# Patient Record
Sex: Male | Born: 1945
Health system: Southern US, Community
[De-identification: ages and names within clinical notes are randomized; demographics above are authoritative.]

## PROBLEM LIST (undated history)

## (undated) DIAGNOSIS — G309 Alzheimer's disease, unspecified: Secondary | ICD-10-CM

## (undated) DIAGNOSIS — F0280 Dementia in other diseases classified elsewhere without behavioral disturbance: Secondary | ICD-10-CM

## (undated) DIAGNOSIS — M79602 Pain in left arm: Secondary | ICD-10-CM

## (undated) HISTORY — PX: KNEE SURGERY: SHX244

## (undated) HISTORY — DX: Pain in left arm: M79.602

## (undated) HISTORY — PX: NM PET IMG ALZHEIMERS: HXRAD13

## (undated) HISTORY — PX: SHOULDER SURGERY: SHX246

---

## 2016-04-14 DIAGNOSIS — L4050 Arthropathic psoriasis, unspecified: Secondary | ICD-10-CM | POA: Diagnosis not present

## 2016-04-14 DIAGNOSIS — L400 Psoriasis vulgaris: Secondary | ICD-10-CM | POA: Diagnosis not present

## 2016-04-14 DIAGNOSIS — Z79899 Other long term (current) drug therapy: Secondary | ICD-10-CM | POA: Diagnosis not present

## 2016-10-03 DIAGNOSIS — R69 Illness, unspecified: Secondary | ICD-10-CM | POA: Diagnosis not present

## 2017-02-10 DIAGNOSIS — Z6833 Body mass index (BMI) 33.0-33.9, adult: Secondary | ICD-10-CM | POA: Diagnosis not present

## 2017-02-10 DIAGNOSIS — M25441 Effusion, right hand: Secondary | ICD-10-CM | POA: Diagnosis not present

## 2017-02-10 DIAGNOSIS — S60221A Contusion of right hand, initial encounter: Secondary | ICD-10-CM | POA: Diagnosis not present

## 2017-10-05 DIAGNOSIS — R69 Illness, unspecified: Secondary | ICD-10-CM | POA: Diagnosis not present

## 2017-10-07 DIAGNOSIS — R69 Illness, unspecified: Secondary | ICD-10-CM | POA: Diagnosis not present

## 2017-11-23 DIAGNOSIS — R69 Illness, unspecified: Secondary | ICD-10-CM | POA: Diagnosis not present

## 2018-02-15 DIAGNOSIS — R69 Illness, unspecified: Secondary | ICD-10-CM | POA: Diagnosis not present

## 2018-04-07 DIAGNOSIS — M792 Neuralgia and neuritis, unspecified: Secondary | ICD-10-CM | POA: Diagnosis not present

## 2018-04-07 DIAGNOSIS — R51 Headache: Secondary | ICD-10-CM | POA: Diagnosis not present

## 2018-04-07 DIAGNOSIS — R69 Illness, unspecified: Secondary | ICD-10-CM | POA: Diagnosis not present

## 2018-04-21 DIAGNOSIS — L400 Psoriasis vulgaris: Secondary | ICD-10-CM | POA: Diagnosis not present

## 2018-04-21 DIAGNOSIS — B029 Zoster without complications: Secondary | ICD-10-CM | POA: Diagnosis not present

## 2018-04-21 DIAGNOSIS — Z79899 Other long term (current) drug therapy: Secondary | ICD-10-CM | POA: Diagnosis not present

## 2018-04-21 DIAGNOSIS — L4050 Arthropathic psoriasis, unspecified: Secondary | ICD-10-CM | POA: Diagnosis not present

## 2018-12-13 DIAGNOSIS — R69 Illness, unspecified: Secondary | ICD-10-CM | POA: Diagnosis not present

## 2019-06-16 DIAGNOSIS — Z6832 Body mass index (BMI) 32.0-32.9, adult: Secondary | ICD-10-CM | POA: Diagnosis not present

## 2019-06-16 DIAGNOSIS — F4310 Post-traumatic stress disorder, unspecified: Secondary | ICD-10-CM | POA: Diagnosis not present

## 2019-06-16 DIAGNOSIS — R4701 Aphasia: Secondary | ICD-10-CM | POA: Diagnosis not present

## 2019-06-16 DIAGNOSIS — R69 Illness, unspecified: Secondary | ICD-10-CM | POA: Diagnosis not present

## 2019-06-16 DIAGNOSIS — E782 Mixed hyperlipidemia: Secondary | ICD-10-CM | POA: Diagnosis not present

## 2019-07-04 DIAGNOSIS — R69 Illness, unspecified: Secondary | ICD-10-CM | POA: Diagnosis not present

## 2019-07-15 DIAGNOSIS — E782 Mixed hyperlipidemia: Secondary | ICD-10-CM | POA: Diagnosis not present

## 2019-07-15 DIAGNOSIS — Z1389 Encounter for screening for other disorder: Secondary | ICD-10-CM | POA: Diagnosis not present

## 2019-07-15 DIAGNOSIS — F4310 Post-traumatic stress disorder, unspecified: Secondary | ICD-10-CM | POA: Diagnosis not present

## 2019-07-15 DIAGNOSIS — L409 Psoriasis, unspecified: Secondary | ICD-10-CM | POA: Diagnosis not present

## 2019-07-15 DIAGNOSIS — Z6833 Body mass index (BMI) 33.0-33.9, adult: Secondary | ICD-10-CM | POA: Diagnosis not present

## 2019-07-15 DIAGNOSIS — R69 Illness, unspecified: Secondary | ICD-10-CM | POA: Diagnosis not present

## 2019-07-15 DIAGNOSIS — Z1331 Encounter for screening for depression: Secondary | ICD-10-CM | POA: Diagnosis not present

## 2019-07-15 DIAGNOSIS — R4701 Aphasia: Secondary | ICD-10-CM | POA: Diagnosis not present

## 2020-02-14 DIAGNOSIS — R69 Illness, unspecified: Secondary | ICD-10-CM | POA: Diagnosis not present

## 2020-03-28 DIAGNOSIS — R69 Illness, unspecified: Secondary | ICD-10-CM | POA: Diagnosis not present

## 2020-05-14 DIAGNOSIS — R69 Illness, unspecified: Secondary | ICD-10-CM | POA: Diagnosis not present

## 2020-10-04 DIAGNOSIS — G309 Alzheimer's disease, unspecified: Secondary | ICD-10-CM | POA: Diagnosis not present

## 2020-10-04 DIAGNOSIS — R410 Disorientation, unspecified: Secondary | ICD-10-CM | POA: Diagnosis not present

## 2020-10-04 DIAGNOSIS — R079 Chest pain, unspecified: Secondary | ICD-10-CM | POA: Diagnosis not present

## 2020-10-04 DIAGNOSIS — R69 Illness, unspecified: Secondary | ICD-10-CM | POA: Diagnosis not present

## 2020-10-04 DIAGNOSIS — R030 Elevated blood-pressure reading, without diagnosis of hypertension: Secondary | ICD-10-CM | POA: Diagnosis not present

## 2021-03-21 DIAGNOSIS — E7849 Other hyperlipidemia: Secondary | ICD-10-CM | POA: Diagnosis not present

## 2021-03-21 DIAGNOSIS — E538 Deficiency of other specified B group vitamins: Secondary | ICD-10-CM | POA: Diagnosis not present

## 2021-03-21 DIAGNOSIS — R69 Illness, unspecified: Secondary | ICD-10-CM | POA: Diagnosis not present

## 2021-03-21 DIAGNOSIS — Z0001 Encounter for general adult medical examination with abnormal findings: Secondary | ICD-10-CM | POA: Diagnosis not present

## 2021-03-21 DIAGNOSIS — G309 Alzheimer's disease, unspecified: Secondary | ICD-10-CM | POA: Diagnosis not present

## 2021-03-21 DIAGNOSIS — Z6830 Body mass index (BMI) 30.0-30.9, adult: Secondary | ICD-10-CM | POA: Diagnosis not present

## 2021-06-26 ENCOUNTER — Encounter: Payer: Self-pay | Admitting: *Deleted

## 2021-07-24 ENCOUNTER — Ambulatory Visit: Payer: Self-pay

## 2021-08-19 DIAGNOSIS — R69 Illness, unspecified: Secondary | ICD-10-CM | POA: Diagnosis not present

## 2021-08-19 DIAGNOSIS — E782 Mixed hyperlipidemia: Secondary | ICD-10-CM | POA: Diagnosis not present

## 2021-08-19 DIAGNOSIS — G309 Alzheimer's disease, unspecified: Secondary | ICD-10-CM | POA: Diagnosis not present

## 2021-09-03 ENCOUNTER — Emergency Department (HOSPITAL_COMMUNITY): Payer: No Typology Code available for payment source

## 2021-09-03 ENCOUNTER — Encounter (HOSPITAL_COMMUNITY): Payer: Self-pay | Admitting: Emergency Medicine

## 2021-09-03 ENCOUNTER — Emergency Department (HOSPITAL_COMMUNITY)
Admission: EM | Admit: 2021-09-03 | Discharge: 2021-09-04 | Disposition: A | Discharge status: Home / Self Care | Payer: No Typology Code available for payment source | Attending: Emergency Medicine | Admitting: Emergency Medicine

## 2021-09-03 ENCOUNTER — Other Ambulatory Visit: Payer: Self-pay

## 2021-09-03 DIAGNOSIS — F0280 Dementia in other diseases classified elsewhere without behavioral disturbance: Secondary | ICD-10-CM | POA: Diagnosis not present

## 2021-09-03 DIAGNOSIS — M79603 Pain in arm, unspecified: Secondary | ICD-10-CM | POA: Diagnosis not present

## 2021-09-03 DIAGNOSIS — G309 Alzheimer's disease, unspecified: Secondary | ICD-10-CM | POA: Diagnosis not present

## 2021-09-03 DIAGNOSIS — I498 Other specified cardiac arrhythmias: Secondary | ICD-10-CM | POA: Diagnosis not present

## 2021-09-03 DIAGNOSIS — M79602 Pain in left arm: Secondary | ICD-10-CM | POA: Insufficient documentation

## 2021-09-03 DIAGNOSIS — R079 Chest pain, unspecified: Secondary | ICD-10-CM | POA: Insufficient documentation

## 2021-09-03 DIAGNOSIS — Z87891 Personal history of nicotine dependence: Secondary | ICD-10-CM | POA: Diagnosis not present

## 2021-09-03 DIAGNOSIS — R0689 Other abnormalities of breathing: Secondary | ICD-10-CM | POA: Diagnosis not present

## 2021-09-03 DIAGNOSIS — R000 Tachycardia, unspecified: Secondary | ICD-10-CM | POA: Diagnosis not present

## 2021-09-03 DIAGNOSIS — Z743 Need for continuous supervision: Secondary | ICD-10-CM | POA: Diagnosis not present

## 2021-09-03 DIAGNOSIS — R404 Transient alteration of awareness: Secondary | ICD-10-CM | POA: Diagnosis not present

## 2021-09-03 HISTORY — DX: Dementia in other diseases classified elsewhere, unspecified severity, without behavioral disturbance, psychotic disturbance, mood disturbance, and anxiety: G30.9

## 2021-09-03 HISTORY — DX: Dementia in other diseases classified elsewhere, unspecified severity, without behavioral disturbance, psychotic disturbance, mood disturbance, and anxiety: F02.80

## 2021-09-03 NOTE — ED Triage Notes (Signed)
Ems called out for left arm pain. Pt has dementia and poor historian.

## 2021-09-03 NOTE — ED Provider Notes (Signed)
[facility] EMERGENCY DEPARTMENT Provider Note   CSN: [id number] Arrival date & time: 09/03/21  2240     History Chief Complaint  Patient presents with   Arm Pain    Christopher Mccoy is a 75 y.o. male.  Patient is a 75 year old male with history of dementia.  Patient presenting today for evaluation of chest pain/left arm pain.  According to the wife he informed her of this this evening.  Wife checked the vital signs with a pulse ox meter and was found to be normal, but his pulse was in the 30s.  She called EMS and patient was transported here.  He was having episodes of ventricular bigeminy by EMS.  Patient is somewhat of a difficult historian secondary to his dementia and offers little useful history.  Majority of history taken from the wife who is present at bedside.  Patient has no prior cardiac history.  The history is provided by the patient.      Past Medical History:  Diagnosis Date   Alzheimer's dementia (HCC)     There are no problems to display for this patient.   Past Surgical History:  Procedure Laterality Date   NM PET IMG ALZHEIMERS         No family history on file.  Social History   Tobacco Use   Smoking status: Former    Types: Cigarettes  Substance Use Topics   Alcohol use: Not Currently   Drug use: Not Currently    Home Medications Prior to Admission medications   Not on File    Allergies    Patient has no known allergies.  Review of Systems   Review of Systems  Unable to perform ROS: Dementia   Physical Exam Updated Vital Signs BP 123/78   Pulse (!) 36   Temp 98.2 F (36.8 C) (Oral)   Resp 19   SpO2 97%   Physical Exam Vitals and nursing note reviewed.  Constitutional:      General: He is not in acute distress.    Appearance: He is well-developed. He is not diaphoretic.  HENT:     Head: Normocephalic and atraumatic.  Cardiovascular:     Rate and Rhythm: Normal rate and regular rhythm.     Heart sounds: No murmur heard.    No friction rub.  Pulmonary:     Effort: Pulmonary effort is normal. No respiratory distress.     Breath sounds: Normal breath sounds. No wheezing or rales.  Abdominal:     General: Bowel sounds are normal. There is no distension.     Palpations: Abdomen is soft.     Tenderness: There is no abdominal tenderness.  Musculoskeletal:        General: No swelling or tenderness. Normal range of motion.     Cervical back: Normal range of motion and neck supple.     Right lower leg: No edema.     Left lower leg: No edema.     Comments: The left arm appears grossly normal.  He has full range of motion of the shoulder and elbow.  Ulnar and radial pulses are easily palpable and motor and sensation are intact throughout both hands.  Skin:    General: Skin is warm and dry.  Neurological:     Mental Status: He is alert and oriented to person, place, and time.     Coordination: Coordination normal.    ED Results / Procedures / Treatments   Labs (all labs ordered are listed, but  only abnormal results are displayed) Labs Reviewed - No data to display  EKG EKG Interpretation  Date/Time:  Tuesday September 03 2021 22:50:13 EST Ventricular Rate:  65 PR Interval:  144 QRS Duration: 96 QT Interval:  402 QTC Calculation: 418 R Axis:   -24 Text Interpretation: Sinus rhythm Supraventricular bigeminy Borderline left axis deviation Confirmed by Geoffery Lyons ([id number]) on 09/03/2021 11:48:44 PM  Radiology No results found.  Procedures Procedures   Medications Ordered in ED Medications - No data to display  ED Course  I have reviewed the triage vital signs and the nursing notes.  Pertinent labs & imaging results that were available during my care of the patient were reviewed by me and considered in my medical decision making (see chart for details).    MDM Rules/Calculators/A&P  Patient with history of dementia presenting with complaints of left arm pain.  This began in the absence of any  injury or trauma.  His wife checked his pulse ox this evening and was getting a heart rate of 40, then called the ambulance.  Patient arrived here with what looked like sinus rhythm with ventricular bigeminy on the rhythm strip, however twelve-lead appears to be atrial bigeminy.  Patient is completely asymptomatic with this and his work-up is unremarkable.  His electrolytes showed no abnormality and troponin is negative.  I highly doubt a cardiac etiology to his arm pain.  At this point, I feel as though he can safely be discharged.  Pain is likely musculoskeletal in nature.  Patient does drink coffee throughout the day and I have advised limiting his caffeine intake.  Final Clinical Impression(s) / ED Diagnoses Final diagnoses:  None    Rx / DC Orders ED Discharge Orders     None        Geoffery Lyons, MD 09/04/21 [phone]

## 2021-09-04 LAB — CBC WITH DIFFERENTIAL/PLATELET
Abs Immature Granulocytes: 0 10*3/uL (ref 0.00–0.07)
Basophils Absolute: 0.1 10*3/uL (ref 0.0–0.1)
Basophils Relative: 1 %
Eosinophils Absolute: 0.2 10*3/uL (ref 0.0–0.5)
Eosinophils Relative: 6 %
HCT: 45.6 % (ref 39.0–52.0)
Hemoglobin: 15.5 g/dL (ref 13.0–17.0)
Immature Granulocytes: 0 %
Lymphocytes Relative: 42 %
Lymphs Abs: 1.8 10*3/uL (ref 0.7–4.0)
MCH: 34.5 pg — ABNORMAL HIGH (ref 26.0–34.0)
MCHC: 34 g/dL (ref 30.0–36.0)
MCV: 101.6 fL — ABNORMAL HIGH (ref 80.0–100.0)
Monocytes Absolute: 0.5 10*3/uL (ref 0.1–1.0)
Monocytes Relative: 12 %
Neutro Abs: 1.6 10*3/uL — ABNORMAL LOW (ref 1.7–7.7)
Neutrophils Relative %: 39 %
Platelets: 181 10*3/uL (ref 150–400)
RBC: 4.49 MIL/uL (ref 4.22–5.81)
RDW: 12.4 % (ref 11.5–15.5)
WBC: 4.1 10*3/uL (ref 4.0–10.5)
nRBC: 0 % (ref 0.0–0.2)

## 2021-09-04 LAB — BASIC METABOLIC PANEL
Anion gap: 7 (ref 5–15)
BUN: 18 mg/dL (ref 8–23)
CO2: 26 mmol/L (ref 22–32)
Calcium: 8.4 mg/dL — ABNORMAL LOW (ref 8.9–10.3)
Chloride: 104 mmol/L (ref 98–111)
Creatinine, Ser: 0.91 mg/dL (ref 0.61–1.24)
GFR, Estimated: >60 mL/min (ref >60)
Glucose, Bld: 104 mg/dL — ABNORMAL HIGH (ref 70–99)
Potassium: 4.1 mmol/L (ref 3.5–5.1)
Sodium: 137 mmol/L (ref 135–145)

## 2021-09-04 LAB — TROPONIN I (HIGH SENSITIVITY): Troponin I (High Sensitivity): 4 ng/L (ref <18)

## 2021-09-04 NOTE — Discharge Instructions (Signed)
Continue medications as previously prescribed.  Take ibuprofen 600 mg every 6 hours as needed for pain.  Return to the emergency department if symptoms significantly worsen or change. 

## 2021-09-10 DIAGNOSIS — M25512 Pain in left shoulder: Secondary | ICD-10-CM | POA: Diagnosis not present

## 2021-09-10 DIAGNOSIS — R69 Illness, unspecified: Secondary | ICD-10-CM | POA: Diagnosis not present

## 2021-09-10 DIAGNOSIS — M19012 Primary osteoarthritis, left shoulder: Secondary | ICD-10-CM | POA: Diagnosis not present

## 2021-09-10 DIAGNOSIS — Z880 Allergy status to penicillin: Secondary | ICD-10-CM | POA: Diagnosis not present

## 2021-09-10 DIAGNOSIS — M79632 Pain in left forearm: Secondary | ICD-10-CM | POA: Diagnosis not present

## 2021-09-10 DIAGNOSIS — W19XXXA Unspecified fall, initial encounter: Secondary | ICD-10-CM | POA: Diagnosis not present

## 2021-09-10 DIAGNOSIS — R008 Other abnormalities of heart beat: Secondary | ICD-10-CM | POA: Diagnosis not present

## 2021-09-10 DIAGNOSIS — M1990 Unspecified osteoarthritis, unspecified site: Secondary | ICD-10-CM | POA: Diagnosis not present

## 2021-09-10 DIAGNOSIS — R55 Syncope and collapse: Secondary | ICD-10-CM | POA: Diagnosis not present

## 2021-09-10 DIAGNOSIS — M79602 Pain in left arm: Secondary | ICD-10-CM | POA: Diagnosis not present

## 2021-09-10 DIAGNOSIS — I499 Cardiac arrhythmia, unspecified: Secondary | ICD-10-CM | POA: Diagnosis not present

## 2021-09-10 DIAGNOSIS — R079 Chest pain, unspecified: Secondary | ICD-10-CM | POA: Diagnosis not present

## 2021-09-10 DIAGNOSIS — G309 Alzheimer's disease, unspecified: Secondary | ICD-10-CM | POA: Diagnosis not present

## 2021-09-10 DIAGNOSIS — R4182 Altered mental status, unspecified: Secondary | ICD-10-CM | POA: Diagnosis not present

## 2021-09-11 DIAGNOSIS — M79632 Pain in left forearm: Secondary | ICD-10-CM | POA: Diagnosis not present

## 2021-09-11 DIAGNOSIS — R4182 Altered mental status, unspecified: Secondary | ICD-10-CM | POA: Diagnosis not present

## 2021-09-19 ENCOUNTER — Encounter: Payer: Self-pay | Admitting: Cardiology

## 2021-09-19 ENCOUNTER — Ambulatory Visit: Payer: Medicare HMO | Admitting: Cardiology

## 2021-09-19 ENCOUNTER — Other Ambulatory Visit: Payer: Self-pay

## 2021-09-19 DIAGNOSIS — M79602 Pain in left arm: Secondary | ICD-10-CM

## 2021-09-19 DIAGNOSIS — R9431 Abnormal electrocardiogram [ECG] [EKG]: Secondary | ICD-10-CM

## 2021-09-19 DIAGNOSIS — F0390 Unspecified dementia without behavioral disturbance: Secondary | ICD-10-CM | POA: Insufficient documentation

## 2021-09-19 DIAGNOSIS — R69 Illness, unspecified: Secondary | ICD-10-CM | POA: Diagnosis not present

## 2021-09-19 DIAGNOSIS — I498 Other specified cardiac arrhythmias: Secondary | ICD-10-CM | POA: Insufficient documentation

## 2021-09-19 DIAGNOSIS — M79603 Pain in arm, unspecified: Secondary | ICD-10-CM | POA: Insufficient documentation

## 2021-09-19 DIAGNOSIS — F02A4 Dementia in other diseases classified elsewhere, mild, with anxiety: Secondary | ICD-10-CM

## 2021-09-19 NOTE — Progress Notes (Signed)
Cardiology Office Note:    Date:  09/19/2021   ID:  Christopher Mccoy, DOB 03-13-1946, MRN [id number]  PCP:  [facility]   [facility] Providers Cardiologist:  None     Referring MD: Estanislado Pandy, MD     History of Present Illness:    Christopher Mccoy is a 75 y.o. male here for the evaluation of of cerebral palsy and bigeminy, with a Hx of alzheimer's dementia (HCC).   During his last visit in the ED (09/11/2021) he had left arm pain that was worse with movement. He has a baseline dementia which limits accurate communication.    Today,  He says he is not doing well, and he his accompanied by his wife.   His wife says that he experiences dizziness at times. She said that a nurse stated he had a troponin level of 20 while he was at the hospital.  He had heart rate of 31 due to the atrial bigeminy.  His wife reports that he recently experienced pain in one of his arms, but he did not experience this during the visit today.  He notes that he works hard a lot and his wife says he has never had heart trouble before.  Given his baseline dementia his history is challenging.  Denies any significant syncopal episodes.  No chest pain.  Seem to perseverate on arm pain.  He denies any palpitations, chest pain, or shortness of breath. No headaches, syncope, orthopnea, PND, lower extremity edema or exertional symptoms.    Past Medical History:  Diagnosis Date   Alzheimer's dementia (HCC)    Left arm pain     Past Surgical History:  Procedure Laterality Date   KNEE SURGERY     NM PET IMG ALZHEIMERS     SHOULDER SURGERY      Current Medications: Current Meds  Medication Sig   atorvastatin (LIPITOR) 10 MG tablet Take 10 mg by mouth daily.   citalopram (CELEXA) 10 MG tablet Take 10 mg by mouth daily.   cyanocobalamin 1000 MCG tablet Take 1,000 mcg by mouth daily.   donepezil (ARICEPT) 10 MG tablet Take 10 mg by mouth at bedtime.   melatonin 5 MG TABS Take 5 mg by  mouth 2 (two) times daily.   memantine (NAMENDA) 10 MG tablet Take 10 mg by mouth 2 (two) times daily.     Allergies:   Penicillins   Social History   Socioeconomic History   Marital status: Married    Spouse name: Not on file   Number of children: Not on file   Years of education: Not on file   Highest education level: Not on file  Occupational History   Not on file  Tobacco Use   Smoking status: Former    Types: Cigarettes   Smokeless tobacco: Not on file  Substance and Sexual Activity   Alcohol use: Not Currently   Drug use: Not Currently   Sexual activity: Not on file  Other Topics Concern   Not on file  Social History Narrative   Not on file   Social Determinants of Health   Financial Resource Strain: Not on file  Food Insecurity: Not on file  Transportation Needs: Not on file  Physical Activity: Not on file  Stress: Not on file  Social Connections: Not on file     Family History: The patient's family history includes Heart failure in his mother; Leukemia in his father.  ROS:   Please see the history  of present illness.    (+) Dizziness (+) Left Arm Pain (one recent episode but not during visit) All other systems reviewed and are negative.  EKGs/Labs/Other Studies Reviewed:    The following studies were reviewed today: Prior records from outside sources reviewed including personally reviewing ECG.  Radiology-arthritis of arm shoulder.  EKG:  EKG from above is personally reviewed and interpreted.  See below for details.  EMS strip corroborated atrial bigeminy as well.   Recent Labs: 09/03/2021: BUN 18; Creatinine, Ser 0.91; Hemoglobin 15.5; Platelets 181; Potassium 4.1; Sodium 137  Recent Lipid Panel No results found for: CHOL, TRIG, HDL, CHOLHDL, VLDL, LDLCALC, LDLDIRECT   Risk Assessment/Calculations:          Physical Exam:    VS:  BP (!) 150/90 (BP Location: Left Arm, Patient Position: Sitting, Cuff Size: Normal)   Pulse 68   Ht 5\' 10"   (1.778 m)   Wt 206 lb (93.4 kg)   SpO2 96%   BMI 29.56 kg/m     Wt Readings from Last 3 Encounters:  09/19/21 206 lb (93.4 kg)     GEN:  Well nourished, well developed in no acute distress HEENT: Normal NECK: No JVD; No carotid bruits LYMPHATICS: No lymphadenopathy CARDIAC: RRR, no murmurs, rubs, gallops RESPIRATORY:  Clear to auscultation without rales, wheezing or rhonchi  ABDOMEN: Soft, non-tender, non-distended MUSCULOSKELETAL:  No edema; No deformity  SKIN: Warm and dry NEUROLOGIC:  Alert and oriented x 3 PSYCHIATRIC:  Normal affect   ASSESSMENT:    1. Atrial bigeminy   2. Nonspecific abnormal electrocardiogram (ECG) (EKG)   3. Pain of left upper extremity   4. Mild dementia associated with other underlying disease, with anxiety    PLAN:    Atrial bigeminy Atrial bigeminy noted on EKG personally reviewed and interpreted.  This is benign.  This would cause the pulse oximeter at home to pick up a pulse of 30 since each skipped beat is not being transmitted.  He has not had any syncope or other adverse events.  Sometimes he may feel dizzy but this is not related.  Challenging to obtain history.  His spouse 14/01/22 helped with historical details.  I think it would be beneficial to check an echocardiogram to ensure proper structure and function of his heart given this atrial arrhythmia.   Nonspecific abnormal electrocardiogram (ECG) (EKG) As described above.  Atrial bigeminy.  Arm pain Musculoskeletal.  Troponins were essentially normal in the emergency department.  Went to [place] as well.  Peaked at 20 very low.  This is not acute coronary syndrome.  Okay to continue with atorvastatin 10 mg a day.  Dementia (HCC) Currently on Aricept and Namenda.  His spouse helps with historical details.            Medication Adjustments/Labs and Tests Ordered: Current medicines are reviewed at length with the patient today.  Concerns regarding medicines are outlined above.   Orders Placed This Encounter  Procedures   ECHOCARDIOGRAM COMPLETE   No orders of the defined types were placed in this encounter.  Patient Instructions  Medication Instructions:  The current medical regimen is effective;  continue present plan and medications.  *If you need a refill on your cardiac medications before your next appointment, please call your pharmacy*  Testing/Procedures: Your physician has requested that you have an echocardiogram. Echocardiography is a painless test that uses sound waves to create images of your heart. It provides your doctor with information about the size and shape  of your heart and how well your heart's chambers and valves are working. This procedure takes approximately one hour. There are no restrictions for this procedure. This will be completed at [facility].   Follow-Up: At [facility], you and your health needs are our priority.  As part of our continuing mission to provide you with exceptional heart care, we have created designated Provider Care Teams.  These Care Teams include your primary Cardiologist (physician) and Advanced Practice Providers (APPs -  Physician Assistants and Nurse Practitioners) who all work together to provide you with the care you need, when you need it.  We recommend signing up for the patient portal called "MyChart".  Sign up information is provided on this After Visit Summary.  MyChart is used to connect with patients for Virtual Visits (Telemedicine).  Patients are able to view lab/test results, encounter notes, upcoming appointments, etc.  Non-urgent messages can be sent to your provider as well.   To learn more about what you can do with MyChart, go to [email or web address].    Your next appointment:   Follow up as needed with Dr Anne Fu.  Thank you for choosing [facility]!!      I,Tinashe Williams,acting as a scribe for [employer], MD.,have documented all relevant documentation on the behalf of  Donato Schultz, MD,as directed by  Donato Schultz, MD while in the presence of Donato Schultz, MD.   I, Donato Schultz, MD, have reviewed all documentation for this visit. The documentation on 09/19/21 for the exam, diagnosis, procedures, and orders are all accurate and complete.   Signed, Donato Schultz, MD  09/19/2021 3:32 PM     [facility]

## 2021-09-19 NOTE — Assessment & Plan Note (Signed)
Currently on Aricept and Namenda.  His spouse helps with historical details.

## 2021-09-19 NOTE — Assessment & Plan Note (Signed)
Atrial bigeminy noted on EKG personally reviewed and interpreted.  This is benign.  This would cause the pulse oximeter at home to pick up a pulse of 30 since each skipped beat is not being transmitted.  He has not had any syncope or other adverse events.  Sometimes he may feel dizzy but this is not related.  Challenging to obtain history.  His spouse Liborio Nixon helped with historical details.  I think it would be beneficial to check an echocardiogram to ensure proper structure and function of his heart given this atrial arrhythmia.

## 2021-09-19 NOTE — Patient Instructions (Signed)
Medication Instructions:  The current medical regimen is effective;  continue present plan and medications.  *If you need a refill on your cardiac medications before your next appointment, please call your pharmacy*  Testing/Procedures: Your physician has requested that you have an echocardiogram. Echocardiography is a painless test that uses sound waves to create images of your heart. It provides your doctor with information about the size and shape of your heart and how well your heart's chambers and valves are working. This procedure takes approximately one hour. There are no restrictions for this procedure. This will be completed at [facility].   Follow-Up: At [facility], you and your health needs are our priority.  As part of our continuing mission to provide you with exceptional heart care, we have created designated Provider Care Teams.  These Care Teams include your primary Cardiologist (physician) and Advanced Practice Providers (APPs -  Physician Assistants and Nurse Practitioners) who all work together to provide you with the care you need, when you need it.  We recommend signing up for the patient portal called "MyChart".  Sign up information is provided on this After Visit Summary.  MyChart is used to connect with patients for Virtual Visits (Telemedicine).  Patients are able to view lab/test results, encounter notes, upcoming appointments, etc.  Non-urgent messages can be sent to your provider as well.   To learn more about what you can do with MyChart, go to [email or web address].    Your next appointment:   Follow up as needed with Dr Anne Fu.  Thank you for choosing [facility]!!

## 2021-09-19 NOTE — Assessment & Plan Note (Signed)
As described above.  Atrial bigeminy.

## 2021-09-19 NOTE — Assessment & Plan Note (Signed)
Musculoskeletal.  Troponins were essentially normal in the emergency department.  Went to [place] as well.  Peaked at 20 very low.  This is not acute coronary syndrome.  Okay to continue with atorvastatin 10 mg a day.

## 2021-10-30 ENCOUNTER — Ambulatory Visit (HOSPITAL_COMMUNITY)
Admission: RE | Admit: 2021-10-30 | Discharge: 2021-10-30 | Disposition: A | Discharge status: Home / Self Care | Payer: Medicare HMO | Source: Ambulatory Visit | Attending: Cardiology | Admitting: Cardiology

## 2021-10-30 ENCOUNTER — Other Ambulatory Visit: Payer: Self-pay

## 2021-10-30 DIAGNOSIS — F0390 Unspecified dementia without behavioral disturbance: Secondary | ICD-10-CM | POA: Insufficient documentation

## 2021-10-30 DIAGNOSIS — I517 Cardiomegaly: Secondary | ICD-10-CM | POA: Insufficient documentation

## 2021-10-30 DIAGNOSIS — I498 Other specified cardiac arrhythmias: Secondary | ICD-10-CM | POA: Insufficient documentation

## 2021-10-30 DIAGNOSIS — R69 Illness, unspecified: Secondary | ICD-10-CM | POA: Diagnosis not present

## 2021-10-30 DIAGNOSIS — R9431 Abnormal electrocardiogram [ECG] [EKG]: Secondary | ICD-10-CM | POA: Diagnosis not present

## 2021-10-30 LAB — ECHOCARDIOGRAM COMPLETE
AR max vel: 2.36 cm2
AV Area VTI: 2.59 cm2
AV Area mean vel: 2.05 cm2
AV Mean grad: 4 mmHg
AV Peak grad: 8.3 mmHg
Ao pk vel: 1.44 m/s
Area-P 1/2: 2.26 cm2
Calc EF: 53.7 %
MV VTI: 2.85 cm2
S' Lateral: 3.2 cm
Single Plane A2C EF: 57.6 %
Single Plane A4C EF: 47.6 %

## 2021-10-30 NOTE — Progress Notes (Signed)
*  PRELIMINARY RESULTS* Echocardiogram 2D Echocardiogram has been performed.  Christopher Mccoy 10/30/2021, 11:43 AM

## 2022-07-10 DIAGNOSIS — L409 Psoriasis, unspecified: Secondary | ICD-10-CM | POA: Diagnosis not present

## 2022-07-10 DIAGNOSIS — F0390 Unspecified dementia without behavioral disturbance: Secondary | ICD-10-CM | POA: Diagnosis not present

## 2022-07-10 DIAGNOSIS — E538 Deficiency of other specified B group vitamins: Secondary | ICD-10-CM | POA: Diagnosis not present

## 2022-07-10 DIAGNOSIS — R69 Illness, unspecified: Secondary | ICD-10-CM | POA: Diagnosis not present

## 2022-07-10 DIAGNOSIS — G309 Alzheimer's disease, unspecified: Secondary | ICD-10-CM | POA: Diagnosis not present

## 2022-07-10 DIAGNOSIS — I493 Ventricular premature depolarization: Secondary | ICD-10-CM | POA: Diagnosis not present

## 2022-07-10 DIAGNOSIS — Z6831 Body mass index (BMI) 31.0-31.9, adult: Secondary | ICD-10-CM | POA: Diagnosis not present

## 2022-07-10 DIAGNOSIS — Z0001 Encounter for general adult medical examination with abnormal findings: Secondary | ICD-10-CM | POA: Diagnosis not present

## 2022-11-28 ENCOUNTER — Other Ambulatory Visit: Payer: Self-pay

## 2022-11-28 ENCOUNTER — Emergency Department (HOSPITAL_COMMUNITY)
Admission: EM | Admit: 2022-11-28 | Discharge: 2022-11-28 | Disposition: A | Discharge status: Home / Self Care | Payer: No Typology Code available for payment source | Attending: Emergency Medicine | Admitting: Emergency Medicine

## 2022-11-28 ENCOUNTER — Encounter (HOSPITAL_COMMUNITY): Payer: Self-pay | Admitting: *Deleted

## 2022-11-28 DIAGNOSIS — H1132 Conjunctival hemorrhage, left eye: Secondary | ICD-10-CM | POA: Insufficient documentation

## 2022-11-28 DIAGNOSIS — H5712 Ocular pain, left eye: Secondary | ICD-10-CM | POA: Diagnosis present

## 2022-11-28 DIAGNOSIS — F0390 Unspecified dementia without behavioral disturbance: Secondary | ICD-10-CM | POA: Insufficient documentation

## 2022-11-28 NOTE — ED Provider Notes (Signed)
[facility] Provider Note   CSN: [id number] Arrival date & time: 11/28/22  1212     History  Chief Complaint  Patient presents with   Eye Problem    Stiles Alpers is a 77 y.o. male.   Eye Problem    This patient is a 77 year old male, history of high cholesterol, he has dementia, currently patient is here because of some increasing bleeding in the left eye.  Evidently the wife noticed this morning that there was a broken blood vessel in the left eye so brought him in for recheck.  Nothing else has been any different, no fevers vomiting chest pain coughing or straining  Home Medications Prior to Admission medications   Medication Sig Start Date End Date Taking? Authorizing Provider  atorvastatin (LIPITOR) 10 MG tablet Take 10 mg by mouth daily.    [provider]  citalopram (CELEXA) 10 MG tablet Take 10 mg by mouth daily.    [provider]  cyanocobalamin 1000 MCG tablet Take 1,000 mcg by mouth daily.    [provider]  donepezil (ARICEPT) 10 MG tablet Take 10 mg by mouth at bedtime.    [provider]  melatonin 5 MG TABS Take 5 mg by mouth 2 (two) times daily.    [provider]  memantine (NAMENDA) 10 MG tablet Take 10 mg by mouth 2 (two) times daily.    [provider]      Allergies    Penicillins    Review of Systems   Review of Systems  Unable to perform ROS: Dementia    Physical Exam Updated Vital Signs BP 114/88 (BP Location: Left Leg)   Pulse 76   Temp 98.6 F (37 C) (Temporal)   Resp 18   Ht 1.778 m (5' 10"$ )   Wt 93 kg   SpO2 99%   BMI 29.41 kg/m  Physical Exam Vitals and nursing note reviewed.  Constitutional:      General: He is not in acute distress.    Appearance: He is well-developed.  HENT:     Head: Normocephalic and atraumatic.     Mouth/Throat:     Pharynx: No oropharyngeal exudate.  Eyes:     General: No scleral icterus.       Right  eye: No discharge.        Left eye: No discharge.     Conjunctiva/sclera: Conjunctivae normal.     Pupils: Pupils are equal, round, and reactive to light.     Comments: Subconjunctival hemorrhage on the left, at the 5 o'clock position, no abnormal pupils, normal extraocular movements  Neck:     Thyroid: No thyromegaly.     Vascular: No JVD.  Cardiovascular:     Rate and Rhythm: Normal rate and regular rhythm.     Heart sounds: Normal heart sounds. No murmur heard.    No friction rub. No gallop.  Pulmonary:     Effort: Pulmonary effort is normal. No respiratory distress.     Breath sounds: Normal breath sounds. No wheezing or rales.  Abdominal:     General: Bowel sounds are normal. There is no distension.     Palpations: Abdomen is soft. There is no mass.     Tenderness: There is no abdominal tenderness.  Musculoskeletal:        General: No tenderness. Normal range of motion.     Cervical back: Normal range of motion and neck supple.  Lymphadenopathy:  Cervical: No cervical adenopathy.  Skin:    General: Skin is warm and dry.     Findings: No erythema or rash.  Neurological:     Mental Status: He is alert.     Coordination: Coordination normal.     Comments: The patient's gait is normal, coordination is normal, strength is normal in all 4 extremities, no facial droop, he does have a stuttering mumbling speech pattern which she states is normal for him  Psychiatric:        Behavior: Behavior normal.     ED Results / Procedures / Treatments   Labs (all labs ordered are listed, but only abnormal results are displayed) Labs Reviewed - No data to display  EKG None  Radiology No results found.  Procedures Procedures    Medications Ordered in ED Medications - No data to display  ED Course/ Medical Decision Making/ A&P                             Medical Decision Making  Subconjunctival hemorrhage, vital signs normal, patient and wife given reassurance, she  states he is at his baseline with dementia, stable for discharge, reassurance given        Final Clinical Impression(s) / ED Diagnoses Final diagnoses:  Subconjunctival hemorrhage of left eye    Rx / DC Orders ED Discharge Orders     None         Noemi Chapel, MD 11/28/22 1526

## 2022-11-28 NOTE — ED Triage Notes (Signed)
Pt's wife states pt's left eye is bloodshot this morning.  Wife states pt is stumbling more than usual.  Pt has dementia and difficult to get history.

## 2022-11-28 NOTE — Discharge Instructions (Signed)
See your family doctor this week for recheck, ER for worsening symptoms

## 2022-11-29 DIAGNOSIS — G309 Alzheimer's disease, unspecified: Secondary | ICD-10-CM | POA: Diagnosis not present

## 2022-11-29 DIAGNOSIS — R69 Illness, unspecified: Secondary | ICD-10-CM | POA: Diagnosis not present

## 2022-11-29 DIAGNOSIS — F17200 Nicotine dependence, unspecified, uncomplicated: Secondary | ICD-10-CM | POA: Diagnosis not present

## 2022-11-29 DIAGNOSIS — R410 Disorientation, unspecified: Secondary | ICD-10-CM | POA: Diagnosis not present

## 2022-11-29 DIAGNOSIS — F02B11 Dementia in other diseases classified elsewhere, moderate, with agitation: Secondary | ICD-10-CM | POA: Diagnosis not present

## 2022-11-29 DIAGNOSIS — R4182 Altered mental status, unspecified: Secondary | ICD-10-CM | POA: Diagnosis not present

## 2022-11-29 DIAGNOSIS — Z880 Allergy status to penicillin: Secondary | ICD-10-CM | POA: Diagnosis not present

## 2023-09-01 IMAGING — DX DG CHEST 1V PORT
1 series · 1 of 1 positions shown · non-contrast
Comparison: 10/04/2020

CLINICAL DATA: Chest pain.  Left arm pain.

EXAM:
PORTABLE CHEST 1 VIEW

[chest ap]
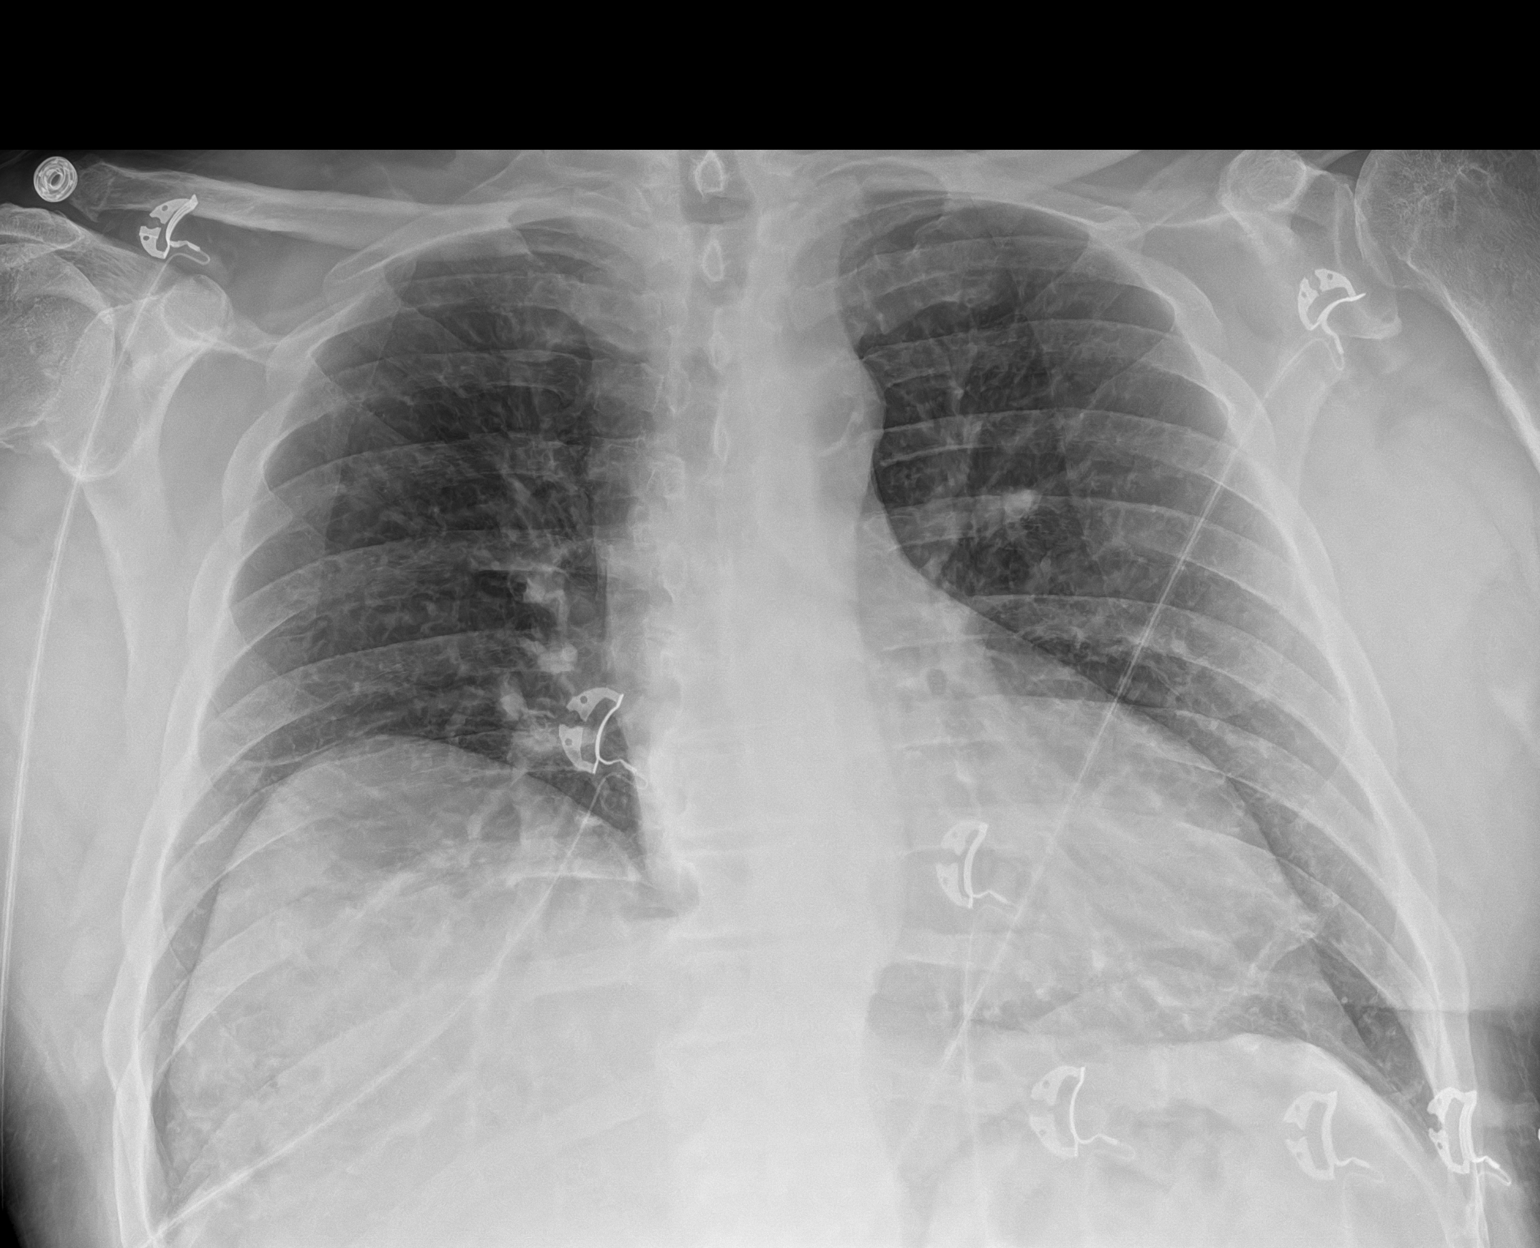

[1 of 1 positions shown; findings below may reference images not displayed]

FINDINGS: Chronic eventration of right hemidiaphragm, unchanged. Stable heart
size and mediastinal contours. Aortic atherosclerosis. There is no
acute airspace disease, pulmonary edema, or large pleural effusion.
No pneumothorax. No acute osseous abnormalities are seen
IMPRESSION: No acute chest findings.  Stable eventration of right hemidiaphragm.

## 2024-04-11 ENCOUNTER — Emergency Department (HOSPITAL_COMMUNITY)

## 2024-04-11 ENCOUNTER — Other Ambulatory Visit: Payer: Self-pay

## 2024-04-11 ENCOUNTER — Emergency Department (HOSPITAL_COMMUNITY)
Admission: EM | Admit: 2024-04-11 | Discharge: 2024-04-12 | Disposition: A | Discharge status: Home / Self Care | Attending: Emergency Medicine | Admitting: Emergency Medicine

## 2024-04-11 ENCOUNTER — Encounter (HOSPITAL_COMMUNITY): Payer: Self-pay

## 2024-04-11 DIAGNOSIS — S0003XA Contusion of scalp, initial encounter: Secondary | ICD-10-CM

## 2024-04-11 DIAGNOSIS — F0280 Dementia in other diseases classified elsewhere without behavioral disturbance: Secondary | ICD-10-CM | POA: Insufficient documentation

## 2024-04-11 DIAGNOSIS — R0989 Other specified symptoms and signs involving the circulatory and respiratory systems: Secondary | ICD-10-CM | POA: Diagnosis not present

## 2024-04-11 DIAGNOSIS — G309 Alzheimer's disease, unspecified: Secondary | ICD-10-CM | POA: Insufficient documentation

## 2024-04-11 DIAGNOSIS — M5030 Other cervical disc degeneration, unspecified cervical region: Secondary | ICD-10-CM | POA: Diagnosis not present

## 2024-04-11 DIAGNOSIS — I951 Orthostatic hypotension: Secondary | ICD-10-CM | POA: Diagnosis not present

## 2024-04-11 DIAGNOSIS — S0101XA Laceration without foreign body of scalp, initial encounter: Secondary | ICD-10-CM | POA: Insufficient documentation

## 2024-04-11 DIAGNOSIS — J9811 Atelectasis: Secondary | ICD-10-CM | POA: Diagnosis not present

## 2024-04-11 DIAGNOSIS — R9389 Abnormal findings on diagnostic imaging of other specified body structures: Secondary | ICD-10-CM | POA: Diagnosis not present

## 2024-04-11 DIAGNOSIS — Y92009 Unspecified place in unspecified non-institutional (private) residence as the place of occurrence of the external cause: Secondary | ICD-10-CM | POA: Diagnosis not present

## 2024-04-11 DIAGNOSIS — Z043 Encounter for examination and observation following other accident: Secondary | ICD-10-CM | POA: Diagnosis not present

## 2024-04-11 DIAGNOSIS — R296 Repeated falls: Secondary | ICD-10-CM

## 2024-04-11 DIAGNOSIS — W1839XA Other fall on same level, initial encounter: Secondary | ICD-10-CM | POA: Diagnosis not present

## 2024-04-11 LAB — CBC WITH DIFFERENTIAL/PLATELET
Abs Immature Granulocytes: 0.01 10*3/uL (ref 0.00–0.07)
Basophils Absolute: 0.1 10*3/uL (ref 0.0–0.1)
Basophils Relative: 1 %
Eosinophils Absolute: 0.2 10*3/uL (ref 0.0–0.5)
Eosinophils Relative: 4 %
HCT: 43.2 % (ref 39.0–52.0)
Hemoglobin: 14.8 g/dL (ref 13.0–17.0)
Immature Granulocytes: 0 %
Lymphocytes Relative: 39 %
Lymphs Abs: 2.1 10*3/uL (ref 0.7–4.0)
MCH: 35.1 pg — ABNORMAL HIGH (ref 26.0–34.0)
MCHC: 34.3 g/dL (ref 30.0–36.0)
MCV: 102.4 fL — ABNORMAL HIGH (ref 80.0–100.0)
Monocytes Absolute: 0.6 10*3/uL (ref 0.1–1.0)
Monocytes Relative: 10 %
Neutro Abs: 2.5 10*3/uL (ref 1.7–7.7)
Neutrophils Relative %: 46 %
Platelets: 210 10*3/uL (ref 150–400)
RBC: 4.22 MIL/uL (ref 4.22–5.81)
RDW: 12.2 % (ref 11.5–15.5)
WBC: 5.5 10*3/uL (ref 4.0–10.5)
nRBC: 0 % (ref 0.0–0.2)

## 2024-04-11 LAB — URINALYSIS, ROUTINE W REFLEX MICROSCOPIC
Bilirubin Urine: NEGATIVE
Glucose, UA: NEGATIVE mg/dL
Hgb urine dipstick: NEGATIVE
Ketones, ur: NEGATIVE mg/dL
Leukocytes,Ua: NEGATIVE
Nitrite: NEGATIVE
Protein, ur: NEGATIVE mg/dL
Specific Gravity, Urine: 1.023 (ref 1.005–1.030)
pH: 6 (ref 5.0–8.0)

## 2024-04-11 LAB — COMPREHENSIVE METABOLIC PANEL WITH GFR
ALT: 19 U/L (ref 0–44)
AST: 20 U/L (ref 15–41)
Albumin: 3.8 g/dL (ref 3.5–5.0)
Alkaline Phosphatase: 74 U/L (ref 38–126)
Anion gap: 11 (ref 5–15)
BUN: 23 mg/dL (ref 8–23)
CO2: 24 mmol/L (ref 22–32)
Calcium: 8.9 mg/dL (ref 8.9–10.3)
Chloride: 106 mmol/L (ref 98–111)
Creatinine, Ser: 0.96 mg/dL (ref 0.61–1.24)
GFR, Estimated: >60 mL/min (ref >60)
Glucose, Bld: 121 mg/dL — ABNORMAL HIGH (ref 70–99)
Potassium: 4.3 mmol/L (ref 3.5–5.1)
Sodium: 141 mmol/L (ref 135–145)
Total Bilirubin: 0.6 mg/dL (ref 0.0–1.2)
Total Protein: 6.8 g/dL (ref 6.5–8.1)

## 2024-04-11 LAB — CBG MONITORING, ED: Glucose-Capillary: 131 mg/dL — ABNORMAL HIGH (ref 70–99)

## 2024-04-11 MED ORDER — LACTATED RINGERS IV BOLUS
1000.0000 mL | Freq: Once | INTRAVENOUS | Status: AC
  Administered 2024-04-11: 1000 mL via INTRAVENOUS

## 2024-04-11 NOTE — ED Notes (Signed)
 Patient transported to CT

## 2024-04-11 NOTE — ED Triage Notes (Signed)
 Pt from home with family, lost balance and fell at home, pt has hematoma to right side of forehead. Pt with hx of alzheimer's. Pt singing in triage. Pt not on blood thinners

## 2024-04-11 NOTE — ED Provider Notes (Signed)
 [place] EMERGENCY DEPARTMENT AT [facility] Provider Note   CSN: [id number] Arrival date & time: 04/11/24  2104     History  Chief Complaint  Patient presents with   Christopher Mccoy is a 78 y.o. male with PMH as listed below who presents with family, lost balance and fell at home, pt has hematoma to right side of forehead. Pt with hx of alzheimer's.  Patient has fallen 3 times in the last 72 hours.  He just acutely loses his balance.  This is not really a problem for him normally.  Pt singing in triage, which is normal for him. Pt not on blood thinners.  Patient's wife and daughter at bedside who state he is acting at baseline right now.  He has not complained of any pain anywhere.  Has not had any nausea vomiting diarrhea, fevers or chills.   Past Medical History:  Diagnosis Date   Alzheimer's dementia (HCC)    Left arm pain        Home Medications Prior to Admission medications   Medication Sig Start Date End Date Taking? Authorizing Provider  atorvastatin (LIPITOR) 10 MG tablet Take 10 mg by mouth daily.    [provider]  citalopram (CELEXA) 10 MG tablet Take 10 mg by mouth daily.    [provider]  cyanocobalamin 1000 MCG tablet Take 1,000 mcg by mouth daily.    [provider]  donepezil (ARICEPT) 10 MG tablet Take 10 mg by mouth at bedtime.    [provider]  melatonin 5 MG TABS Take 5 mg by mouth 2 (two) times daily.    [provider]  memantine (NAMENDA) 10 MG tablet Take 10 mg by mouth 2 (two) times daily.    [provider]      Allergies    Penicillins    Review of Systems   Review of Systems A 10 point review of systems was performed and is negative unless otherwise reported in HPI.  Physical Exam Updated Vital Signs BP 123/76   Pulse 72   Temp 98.5 F (36.9 C)   Resp 17   Ht 5' 10 (1.778 m)   Wt 93 kg   SpO2 94%   BMI 29.42 kg/m  Physical Exam General: Normal appearing  elderly male, lying in bed.  HEENT: PERRLA, 3x3 cm hematoma to L forehead with ecchymoses that appears several days old. Also small superficial lacerations x 3 to posterior scalp, hemostatic. No skull depressions. Sclera anicteric, MMM, trachea midline. No facial trauma noted. Stable forehead, midface, nasal bridge. Cardiology: RRR, no murmurs/rubs/gallops. BL radial and DP pulses equal bilaterally. No apparent chest wall TTP.  Resp: Normal respiratory rate and effort. CTAB, no wheezes, rhonchi, crackles.  Abd: Soft, non-tender, non-distended. No rebound tenderness or guarding.  Pelvis: pelvis stable/nontender. MSK: No peripheral edema or signs of trauma. Extremities without deformity or TTP. Skin: warm, dry. Back: No CVA tenderness. No apparent C, T or L spine TTP deformities or stepoffs. Neuro: Alert, verbal but does not respond to questions.  Nonsensical speech and singing. CNs II-XII grossly intact. 5/5 strength in all extremities. Sensation grossly intact.  Psych: Normal mood and affect.   ED Results / Procedures / Treatments   Labs (all labs ordered are listed, but only abnormal results are displayed) Labs Reviewed  CBC WITH DIFFERENTIAL/PLATELET - Abnormal; Notable for the following components:      Result Value   MCV 102.4 (*)  MCH 35.1 (*)    All other components within normal limits  COMPREHENSIVE METABOLIC PANEL WITH GFR - Abnormal; Notable for the following components:   Glucose, Bld 121 (*)    All other components within normal limits  CBG MONITORING, ED - Abnormal; Notable for the following components:   Glucose-Capillary 131 (*)    All other components within normal limits  URINALYSIS, ROUTINE W REFLEX MICROSCOPIC    EKG EKG Interpretation Date/Time:  Monday April 11 2024 21:51:58 EDT Ventricular Rate:  71 PR Interval:  141 QRS Duration:  93 QT Interval:  390 QTC Calculation: 424 R Axis:   -8  Text Interpretation: Sinus rhythm Artifact T wave abnormality  Confirmed by Garrick Charleston [phone]) on 04/11/2024 10:06:35 PM  Radiology DG Chest Portable 1 View Result Date: 04/11/2024 CLINICAL DATA:  Recurrent falls EXAM: PORTABLE CHEST 1 VIEW COMPARISON:  09/11/2021 FINDINGS: Heart and mediastinal contours are within normal limits. Low lung volumes. Elevation of the right hemidiaphragm with right base atelectasis. No confluent opacity on the left. No effusions or pneumothorax. No acute bony abnormality. IMPRESSION: Low lung volumes. Stable chronic elevation of the right hemidiaphragm with right base atelectasis. Electronically Signed   By: Franky Crease M.D.   On: 04/11/2024 22:35   CT Head Wo Contrast Result Date: 04/11/2024 CLINICAL DATA:  Recent fall with headaches and neck pain, initial encounter EXAM: CT HEAD WITHOUT CONTRAST CT CERVICAL SPINE WITHOUT CONTRAST TECHNIQUE: Multidetector CT imaging of the head and cervical spine was performed following the standard protocol without intravenous contrast. Multiplanar CT image reconstructions of the cervical spine were also generated. RADIATION DOSE REDUCTION: This exam was performed according to the departmental dose-optimization program which includes automated exposure control, adjustment of the mA and/or kV according to patient size and/or use of iterative reconstruction technique. COMPARISON:  None Available. FINDINGS: CT HEAD FINDINGS Brain: No evidence of acute infarction, hemorrhage, hydrocephalus, extra-axial collection or mass lesion/mass effect. Vascular: No hyperdense vessel or unexpected calcification. Skull: Normal. Negative for fracture or focal lesion. Sinuses/Orbits: No acute finding. Other: Small scalp hematoma is noted in the right frontal region. CT CERVICAL SPINE FINDINGS Alignment: Mild straightening of the normal cervical lordosis is noted. Skull base and vertebrae: 7 cervical segments are well visualized. Mild osteophytic changes and facet hypertrophic changes are noted. No acute fracture or acute  facet abnormality is noted. The odontoid is within normal limits. Soft tissues and spinal canal: Surrounding soft tissue structures are within normal limits. Upper chest: Visualized lung apices are unremarkable. Other: None IMPRESSION: CT of the head: Mild scalp hematoma on the right. No acute intracranial abnormality noted. CT of the cervical spine: Mild degenerative changes without acute abnormality. Electronically Signed   By: Oneil Devonshire M.D.   On: 04/11/2024 22:01   CT Cervical Spine Wo Contrast Result Date: 04/11/2024 CLINICAL DATA:  Recent fall with headaches and neck pain, initial encounter EXAM: CT HEAD WITHOUT CONTRAST CT CERVICAL SPINE WITHOUT CONTRAST TECHNIQUE: Multidetector CT imaging of the head and cervical spine was performed following the standard protocol without intravenous contrast. Multiplanar CT image reconstructions of the cervical spine were also generated. RADIATION DOSE REDUCTION: This exam was performed according to the departmental dose-optimization program which includes automated exposure control, adjustment of the mA and/or kV according to patient size and/or use of iterative reconstruction technique. COMPARISON:  None Available. FINDINGS: CT HEAD FINDINGS Brain: No evidence of acute infarction, hemorrhage, hydrocephalus, extra-axial collection or mass lesion/mass effect. Vascular: No hyperdense vessel or unexpected calcification. Skull:  Normal. Negative for fracture or focal lesion. Sinuses/Orbits: No acute finding. Other: Small scalp hematoma is noted in the right frontal region. CT CERVICAL SPINE FINDINGS Alignment: Mild straightening of the normal cervical lordosis is noted. Skull base and vertebrae: 7 cervical segments are well visualized. Mild osteophytic changes and facet hypertrophic changes are noted. No acute fracture or acute facet abnormality is noted. The odontoid is within normal limits. Soft tissues and spinal canal: Surrounding soft tissue structures are within  normal limits. Upper chest: Visualized lung apices are unremarkable. Other: None IMPRESSION: CT of the head: Mild scalp hematoma on the right. No acute intracranial abnormality noted. CT of the cervical spine: Mild degenerative changes without acute abnormality. Electronically Signed   By: Oneil Devonshire M.D.   On: 04/11/2024 22:01    Procedures Procedures    Medications Ordered in ED Medications  lactated ringers bolus 1,000 mL (has no administration in time range)    ED Course/ Medical Decision Making/ A&P                          Medical Decision Making Amount and/or Complexity of Data Reviewed Labs: ordered. Decision-making details documented in ED Course. Radiology: ordered. Decision-making details documented in ED Course.    This patient presents to the ED for concern of recurrent falls, head trauma, this involves an extensive number of treatment options, and is a complaint that carries with it a high risk of complications and morbidity.  I considered the following differential and admission for this acute, potentially life threatening condition. Reassuringly patient is very well-appearing, HDS, at his BL mental status.  MDM:    DDX for trauma includes but is not limited to:  -Reassuringly no ICH, skull fracture, NPH on a CT scan.  No CT C-spine abnormality either.  Chest x-ray is stable without any rib fractures or fluid collections.  Patient is overall very well-appearing.    For patient's recurrent falls, he has no electrolyte derangements, fever or chills or signs of infectious processes and no pneumonia on his chest x-ray, UA is negative for UTI, no anemia or signs of heart failure.  EKG without any signs of ischemia or arrhythmia.  Does have very positive orthostatics with blood pressure that drops from 120s systolic into the 80s systolic just with sitting up.  Discussed with patient's wife who states that patient does have a very difficult time staying hydrated and drinking  water.  I state that this is a very common problem and dementia.  Will give patient 1 L of fluids and recheck orthostatics.  Patient does not take any medicine for his blood pressure.   Clinical Course as of 04/11/24 2319  Mon Apr 11, 2024  2151 Glucose-Capillary(!): 131 [HN]  2242 Urinalysis, Routine w reflex microscopic -Urine, Clean Catch Neg for infection [HN]  2243 CT Head Wo Contrast CT of the head: Mild scalp hematoma on the right. No acute intracranial abnormality noted.  CT of the cervical spine: Mild degenerative changes without acute abnormality.   [HN]  2243 DG Chest Portable 1 View Low lung volumes. Stable chronic elevation of the right hemidiaphragm with right base atelectasis.   [HN]    Clinical Course User Index [HN] Franklyn Sid SAILOR, MD    Labs: I Ordered, and personally interpreted labs.  The pertinent results include:  those listed above  Imaging Studies ordered: I ordered imaging studies including CTH, CT C-spine, CXR I independently visualized and interpreted imaging. I  agree with the radiologist interpretation  Additional history obtained from chart review, family at bedside.  Cardiac Monitoring: The patient was maintained on a cardiac monitor.  I personally viewed and interpreted the cardiac monitored which showed an underlying rhythm of: NSR  Social Determinants of Health: Lives with family  Disposition:  Patient is signed out to the oncoming ED physician Dr. Raford who is made aware of her history, presentation, exam, workup, and plan.  Plan is to give a liter of fluid and recheck orthostatics.   Co morbidities that complicate the patient evaluation  Past Medical History:  Diagnosis Date   Alzheimer's dementia (HCC)    Left arm pain      Medicines Meds ordered this encounter  Medications   lactated ringers bolus 1,000 mL    I have reviewed the patients home medicines and have made adjustments as needed  Problem List / ED  Course: Problem List Items Addressed This Visit   None Visit Diagnoses       Orthostatic hypotension    -  Primary     Recurrent falls         Hematoma of frontal scalp, initial encounter                       This note was created using dictation software, which may contain spelling or grammatical errors.    Franklyn Sid SAILOR, MD 04/11/24 [phone]

## 2024-04-11 NOTE — ED Provider Notes (Signed)
 Care assumed from Dr. Franklyn, patient with falls at home, negative CT scans. He has normal labs, but hasorthostatic hypotension. He will get IV fluids, then repeat orthostatic vital signs.  Following IV fluids, orthostatic vital signs showed no significant drop in blood pressure or rise in heart rate.  Falls are likely secondary to orthostatic hypotension related to inadequate fluid intake.  I am discharging him with instructions to drink plenty of fluids.  He will need to follow-up with his primary care provider at the [facility] clinic.  Return to the emergency department for any new or concerning symptoms.   Christopher Lenis, MD 04/12/24 [phone]

## 2024-04-12 MED ORDER — MEMANTINE HCL 10 MG PO TABS
10.0000 mg | ORAL_TABLET | Freq: Once | ORAL | Status: AC
  Administered 2024-04-12: 10 mg via ORAL
  Filled 2024-04-12: qty 1

## 2024-04-12 MED ORDER — MELATONIN 3 MG PO TABS
6.0000 mg | ORAL_TABLET | Freq: Once | ORAL | Status: AC
  Administered 2024-04-12: 6 mg via ORAL
  Filled 2024-04-12: qty 2

## 2024-04-12 MED ORDER — DONEPEZIL HCL 5 MG PO TABS
10.0000 mg | ORAL_TABLET | Freq: Once | ORAL | Status: DC
  Filled 2024-04-12: qty 2

## 2024-04-12 NOTE — Discharge Instructions (Signed)
 Make sure to drink plenty of fluids.  Return to the emergency department if there are any problems.

## 2024-04-13 ENCOUNTER — Emergency Department (HOSPITAL_COMMUNITY)

## 2024-04-13 ENCOUNTER — Inpatient Hospital Stay (HOSPITAL_COMMUNITY)
Admission: EM | Admit: 2024-04-13 | Discharge: 2024-04-18 | DRG: 689 | Disposition: A | Discharge status: Home Health | Attending: Family Medicine | Admitting: Family Medicine

## 2024-04-13 ENCOUNTER — Other Ambulatory Visit: Payer: Self-pay

## 2024-04-13 ENCOUNTER — Encounter (HOSPITAL_COMMUNITY): Payer: Self-pay

## 2024-04-13 DIAGNOSIS — E785 Hyperlipidemia, unspecified: Secondary | ICD-10-CM | POA: Diagnosis present

## 2024-04-13 DIAGNOSIS — Z66 Do not resuscitate: Secondary | ICD-10-CM | POA: Diagnosis present

## 2024-04-13 DIAGNOSIS — N390 Urinary tract infection, site not specified: Secondary | ICD-10-CM | POA: Diagnosis not present

## 2024-04-13 DIAGNOSIS — Z9181 History of falling: Secondary | ICD-10-CM

## 2024-04-13 DIAGNOSIS — R4182 Altered mental status, unspecified: Principal | ICD-10-CM

## 2024-04-13 DIAGNOSIS — G9341 Metabolic encephalopathy: Secondary | ICD-10-CM | POA: Diagnosis present

## 2024-04-13 DIAGNOSIS — F02818 Dementia in other diseases classified elsewhere, unspecified severity, with other behavioral disturbance: Secondary | ICD-10-CM | POA: Diagnosis present

## 2024-04-13 DIAGNOSIS — F0390 Unspecified dementia without behavioral disturbance: Secondary | ICD-10-CM | POA: Diagnosis present

## 2024-04-13 DIAGNOSIS — G309 Alzheimer's disease, unspecified: Secondary | ICD-10-CM | POA: Diagnosis present

## 2024-04-13 DIAGNOSIS — F17210 Nicotine dependence, cigarettes, uncomplicated: Secondary | ICD-10-CM | POA: Diagnosis present

## 2024-04-13 DIAGNOSIS — E860 Dehydration: Secondary | ICD-10-CM | POA: Diagnosis present

## 2024-04-13 DIAGNOSIS — R296 Repeated falls: Secondary | ICD-10-CM | POA: Diagnosis present

## 2024-04-13 DIAGNOSIS — N3000 Acute cystitis without hematuria: Secondary | ICD-10-CM | POA: Diagnosis not present

## 2024-04-13 DIAGNOSIS — I951 Orthostatic hypotension: Secondary | ICD-10-CM | POA: Diagnosis present

## 2024-04-13 DIAGNOSIS — I5032 Chronic diastolic (congestive) heart failure: Secondary | ICD-10-CM | POA: Diagnosis present

## 2024-04-13 DIAGNOSIS — Z8249 Family history of ischemic heart disease and other diseases of the circulatory system: Secondary | ICD-10-CM

## 2024-04-13 DIAGNOSIS — Z79899 Other long term (current) drug therapy: Secondary | ICD-10-CM

## 2024-04-13 DIAGNOSIS — B9620 Unspecified Escherichia coli [E. coli] as the cause of diseases classified elsewhere: Secondary | ICD-10-CM | POA: Diagnosis present

## 2024-04-13 LAB — COMPREHENSIVE METABOLIC PANEL WITH GFR
ALT: 22 U/L (ref 0–44)
AST: 22 U/L (ref 15–41)
Albumin: 3.4 g/dL — ABNORMAL LOW (ref 3.5–5.0)
Alkaline Phosphatase: 66 U/L (ref 38–126)
Anion gap: 11 (ref 5–15)
BUN: 20 mg/dL (ref 8–23)
CO2: 23 mmol/L (ref 22–32)
Calcium: 8.6 mg/dL — ABNORMAL LOW (ref 8.9–10.3)
Chloride: 103 mmol/L (ref 98–111)
Creatinine, Ser: 0.91 mg/dL (ref 0.61–1.24)
GFR, Estimated: >60 mL/min (ref >60)
Glucose, Bld: 134 mg/dL — ABNORMAL HIGH (ref 70–99)
Potassium: 4.3 mmol/L (ref 3.5–5.1)
Sodium: 137 mmol/L (ref 135–145)
Total Bilirubin: 1.6 mg/dL — ABNORMAL HIGH (ref 0.0–1.2)
Total Protein: 6.5 g/dL (ref 6.5–8.1)

## 2024-04-13 LAB — CBC
HCT: 39.6 % (ref 39.0–52.0)
Hemoglobin: 13.4 g/dL (ref 13.0–17.0)
MCH: 34.4 pg — ABNORMAL HIGH (ref 26.0–34.0)
MCHC: 33.8 g/dL (ref 30.0–36.0)
MCV: 101.8 fL — ABNORMAL HIGH (ref 80.0–100.0)
Platelets: 149 10*3/uL — ABNORMAL LOW (ref 150–400)
RBC: 3.89 MIL/uL — ABNORMAL LOW (ref 4.22–5.81)
RDW: 12.1 % (ref 11.5–15.5)
WBC: 10.8 10*3/uL — ABNORMAL HIGH (ref 4.0–10.5)
nRBC: 0 % (ref 0.0–0.2)

## 2024-04-13 NOTE — ED Notes (Signed)
 Pt became very agitated, swinging trying to hit RN while attempting to start IV, family at bedside. Dr Theadore made aware- CT ABD order changed to non contrast- no IV needed.

## 2024-04-13 NOTE — ED Triage Notes (Addendum)
 Pt arrives with wife and daughter with reports of increased confusion at home was seen last week for a fall and hitting his head. Pt had a CT scan and was discharged home. Pt has history of alzheimer's. Wife states that he had BM at home but has been grabbing himself and his side which made them think he was having pain. Daughter reports urine was dark at home.

## 2024-04-13 NOTE — ED Provider Notes (Signed)
 [facility] Emergency Department Provider Note MRN:  [id number]  Arrival date & time: 04/14/24     Chief Complaint   Altered Mental Status   History of Present Illness   Christopher Mccoy is a 78 y.o. year-old male with a history of dementia presenting to the ED with chief complaint of altered mental status.  3 falls over the past few days, has been seen here in the emergency department and diagnosed with orthostatic hypotension due to dehydration.  Today complaining of right lower quadrant pain, less steady on his feet, more confused than normal.  Review of Systems  A thorough review of systems was obtained and all systems are negative except as noted in the HPI and PMH.   Patient's Health History    Past Medical History:  Diagnosis Date   Alzheimer's dementia (HCC)    Left arm pain     Past Surgical History:  Procedure Laterality Date   KNEE SURGERY     NM PET IMG ALZHEIMERS     SHOULDER SURGERY      Family History  Problem Relation Age of Onset   Heart failure Mother    Leukemia Father     Social History   Socioeconomic History   Marital status: Married    Spouse name: Not on file   Number of children: Not on file   Years of education: Not on file   Highest education level: Not on file  Occupational History   Not on file  Tobacco Use   Smoking status: Every Day    Types: Cigarettes   Smokeless tobacco: Not on file  Substance and Sexual Activity   Alcohol  use: Not Currently   Drug use: Not Currently   Sexual activity: Not on file  Other Topics Concern   Not on file  Social History Narrative   Not on file   Social Drivers of Health   Financial Resource Strain: Not on file  Food Insecurity: Not on file  Transportation Needs: Not on file  Physical Activity: Not on file  Stress: Not on file  Social Connections: Not on file  Intimate Partner Violence: Not on file     Physical Exam   Vitals:   04/14/24 0100 04/14/24 0200  BP:  92/80 96/65  Pulse: 90 80  Resp: 19 (!) 23  Temp: 98.5 F (36.9 C)   SpO2: 97% 92%    CONSTITUTIONAL: Well-appearing, NAD NEURO/PSYCH: Somnolent, moves all extremities EYES:  eyes equal and reactive ENT/NECK:  no LAD, no JVD CARDIO: Regular rate, well-perfused, normal S1 and S2 PULM:  CTAB no wheezing or rhonchi GI/GU:  non-distended, non-tender MSK/SPINE:  No gross deformities, no edema SKIN:  no rash, atraumatic   *Additional and/or pertinent findings included in MDM below  Diagnostic and Interventional Summary    EKG Interpretation Date/Time:  Wednesday April 13 2024 23:14:37 EDT Ventricular Rate:  74 PR Interval:  139 QRS Duration:  101 QT Interval:  384 QTC Calculation: 426 R Axis:   -8  Text Interpretation: Sinus rhythm RSR' in V1 or V2, right VCD or RVH Nonspecific T abnormalities, lateral leads Confirmed by Theadore Sharper [phone]) on 04/13/2024 11:42:09 PM       Labs Reviewed  CBC - Abnormal; Notable for the following components:      Result Value   WBC 10.8 (*)    RBC 3.89 (*)    MCV 101.8 (*)    MCH 34.4 (*)    Platelets 149 (*)  All other components within normal limits  COMPREHENSIVE METABOLIC PANEL WITH GFR - Abnormal; Notable for the following components:   Glucose, Bld 134 (*)    Calcium 8.6 (*)    Albumin 3.4 (*)    Total Bilirubin 1.6 (*)    All other components within normal limits  URINALYSIS, ROUTINE W REFLEX MICROSCOPIC - Abnormal; Notable for the following components:   APPearance CLOUDY (*)    pH 9.0 (*)    Hgb urine dipstick MODERATE (*)    Protein, ur 100 (*)    Leukocytes,Ua LARGE (*)    Bacteria, UA RARE (*)    All other components within normal limits    CT HEAD WO CONTRAST ( )  Final Result    CT ABDOMEN PELVIS WO CONTRAST  Final Result    DG Chest Port 1 View  Final Result      Medications  cefTRIAXone (ROCEPHIN) 2 g in sodium chloride 0.9 % 100 mL IVPB (has no administration in time range)     Procedures  /   Critical Care Procedures  ED Course and Medical Decision Making  Initial Impression and Ddx Differential diagnosis includes intracranial bleeding, electrolyte disturbance, UTI, appendicitis, diverticulitis  Past medical/surgical history that increases complexity of ED encounter: Dementia  Interpretation of Diagnostics I personally reviewed the EKG and my interpretation is as follows: Sinus rhythm  No significant blood count or electrolyte disturbance.  Urinalysis suspicious for infection with CT supporting this.  No other acute findings.  Patient Reassessment and Ultimate Disposition/Management     Will admit to medicine for UTI, mild altered mental status.  Patient management required discussion with the following services or consulting groups:  Hospitalist Service  Complexity of Problems Addressed Acute illness or injury that poses threat of life of bodily function  Additional Data Reviewed and Analyzed Further history obtained from: Further history from spouse/family member  Additional Factors Impacting ED Encounter Risk Consideration of hospitalization  [name], MD [facility] Emergency Medicine [facility] [email or web address]  Final Clinical Impressions(s) / ED Diagnoses     ICD-10-CM   1. Altered mental status, unspecified altered mental status type  R41.82     2. Acute cystitis without hematuria  N30.00       ED Discharge Orders     None        Discharge Instructions Discussed with and Provided to Patient:   Discharge Instructions   None      Theadore Ozell HERO, MD 04/14/24 [phone]

## 2024-04-14 DIAGNOSIS — G309 Alzheimer's disease, unspecified: Secondary | ICD-10-CM | POA: Diagnosis present

## 2024-04-14 DIAGNOSIS — F17210 Nicotine dependence, cigarettes, uncomplicated: Secondary | ICD-10-CM | POA: Diagnosis present

## 2024-04-14 DIAGNOSIS — E7800 Pure hypercholesterolemia, unspecified: Secondary | ICD-10-CM | POA: Diagnosis not present

## 2024-04-14 DIAGNOSIS — Z79899 Other long term (current) drug therapy: Secondary | ICD-10-CM | POA: Diagnosis not present

## 2024-04-14 DIAGNOSIS — R296 Repeated falls: Secondary | ICD-10-CM | POA: Diagnosis present

## 2024-04-14 DIAGNOSIS — F02A4 Dementia in other diseases classified elsewhere, mild, with anxiety: Secondary | ICD-10-CM | POA: Diagnosis not present

## 2024-04-14 DIAGNOSIS — E785 Hyperlipidemia, unspecified: Secondary | ICD-10-CM

## 2024-04-14 DIAGNOSIS — F02818 Dementia in other diseases classified elsewhere, unspecified severity, with other behavioral disturbance: Secondary | ICD-10-CM | POA: Diagnosis present

## 2024-04-14 DIAGNOSIS — Z8249 Family history of ischemic heart disease and other diseases of the circulatory system: Secondary | ICD-10-CM | POA: Diagnosis not present

## 2024-04-14 DIAGNOSIS — N3000 Acute cystitis without hematuria: Principal | ICD-10-CM

## 2024-04-14 DIAGNOSIS — F02C11 Dementia in other diseases classified elsewhere, severe, with agitation: Secondary | ICD-10-CM | POA: Diagnosis not present

## 2024-04-14 DIAGNOSIS — E7849 Other hyperlipidemia: Secondary | ICD-10-CM | POA: Diagnosis not present

## 2024-04-14 DIAGNOSIS — B9620 Unspecified Escherichia coli [E. coli] as the cause of diseases classified elsewhere: Secondary | ICD-10-CM | POA: Diagnosis present

## 2024-04-14 DIAGNOSIS — I951 Orthostatic hypotension: Secondary | ICD-10-CM | POA: Diagnosis present

## 2024-04-14 DIAGNOSIS — N390 Urinary tract infection, site not specified: Secondary | ICD-10-CM | POA: Diagnosis present

## 2024-04-14 DIAGNOSIS — Z66 Do not resuscitate: Secondary | ICD-10-CM | POA: Diagnosis present

## 2024-04-14 DIAGNOSIS — F0393 Unspecified dementia, unspecified severity, with mood disturbance: Secondary | ICD-10-CM | POA: Diagnosis not present

## 2024-04-14 DIAGNOSIS — G9341 Metabolic encephalopathy: Secondary | ICD-10-CM | POA: Diagnosis present

## 2024-04-14 DIAGNOSIS — E860 Dehydration: Secondary | ICD-10-CM | POA: Diagnosis present

## 2024-04-14 DIAGNOSIS — I5032 Chronic diastolic (congestive) heart failure: Secondary | ICD-10-CM | POA: Diagnosis present

## 2024-04-14 DIAGNOSIS — Z9181 History of falling: Secondary | ICD-10-CM | POA: Diagnosis not present

## 2024-04-14 LAB — CBC
HCT: 38.1 % — ABNORMAL LOW (ref 39.0–52.0)
Hemoglobin: 13.2 g/dL (ref 13.0–17.0)
MCH: 34.9 pg — ABNORMAL HIGH (ref 26.0–34.0)
MCHC: 34.6 g/dL (ref 30.0–36.0)
MCV: 100.8 fL — ABNORMAL HIGH (ref 80.0–100.0)
Platelets: 141 10*3/uL — ABNORMAL LOW (ref 150–400)
RBC: 3.78 MIL/uL — ABNORMAL LOW (ref 4.22–5.81)
RDW: 12.2 % (ref 11.5–15.5)
WBC: 8.9 10*3/uL (ref 4.0–10.5)
nRBC: 0 % (ref 0.0–0.2)

## 2024-04-14 LAB — URINALYSIS, ROUTINE W REFLEX MICROSCOPIC
Bilirubin Urine: NEGATIVE
Glucose, UA: NEGATIVE mg/dL
Ketones, ur: NEGATIVE mg/dL
Nitrite: NEGATIVE
Protein, ur: 100 mg/dL — AB
RBC / HPF: >50 RBC/hpf (ref 0–5)
Specific Gravity, Urine: 1.014 (ref 1.005–1.030)
WBC, UA: >50 WBC/hpf (ref 0–5)
pH: 9 — ABNORMAL HIGH (ref 5.0–8.0)

## 2024-04-14 LAB — VITAMIN B12: Vitamin B-12: 1146 pg/mL — ABNORMAL HIGH (ref 180–914)

## 2024-04-14 LAB — CREATININE, SERUM
Creatinine, Ser: 0.77 mg/dL (ref 0.61–1.24)
GFR, Estimated: >60 mL/min (ref >60)

## 2024-04-14 LAB — MAGNESIUM: Magnesium: 1.7 mg/dL (ref 1.7–2.4)

## 2024-04-14 MED ORDER — MELATONIN 5 MG PO TABS
5.0000 mg | ORAL_TABLET | Freq: Every day | ORAL | Status: DC
  Filled 2024-04-14: qty 1

## 2024-04-14 MED ORDER — ONDANSETRON HCL 4 MG/2ML IJ SOLN: 4.0000 mg | Freq: Four times a day (QID) | INTRAMUSCULAR | Status: DC | PRN

## 2024-04-14 MED ORDER — DROPERIDOL 2.5 MG/ML IJ SOLN
2.5000 mg | Freq: Once | INTRAMUSCULAR | Status: DC
  Filled 2024-04-14: qty 2

## 2024-04-14 MED ORDER — HALOPERIDOL LACTATE 5 MG/ML IJ SOLN
1.0000 mg | Freq: Three times a day (TID) | INTRAMUSCULAR | Status: DC | PRN
  Administered 2024-04-14 – 2024-04-16 (×3): 1 mg via INTRAVENOUS
  Filled 2024-04-14 (×3): qty 1

## 2024-04-14 MED ORDER — QUETIAPINE FUMARATE 25 MG PO TABS
50.0000 mg | ORAL_TABLET | Freq: Two times a day (BID) | ORAL | Status: DC
  Administered 2024-04-14 – 2024-04-16 (×5): 50 mg via ORAL
  Filled 2024-04-14 (×5): qty 2

## 2024-04-14 MED ORDER — SODIUM CHLORIDE 0.9 % IV SOLN
1.0000 g | INTRAVENOUS | Status: DC
  Administered 2024-04-15 – 2024-04-17 (×3): 1 g via INTRAVENOUS
  Filled 2024-04-14 (×3): qty 10

## 2024-04-14 MED ORDER — VITAMIN B-12 1000 MCG PO TABS
1000.0000 ug | ORAL_TABLET | Freq: Every day | ORAL | Status: DC
  Administered 2024-04-15 – 2024-04-17 (×3): 1000 ug via ORAL
  Filled 2024-04-14 (×3): qty 1

## 2024-04-14 MED ORDER — SODIUM CHLORIDE 0.9 % IV SOLN
2.0000 g | Freq: Once | INTRAVENOUS | Status: AC
  Administered 2024-04-14: 2 g via INTRAVENOUS
  Filled 2024-04-14: qty 20

## 2024-04-14 MED ORDER — DROPERIDOL 2.5 MG/ML IJ SOLN
5.0000 mg | Freq: Once | INTRAMUSCULAR | Status: AC
  Administered 2024-04-14: 5 mg via INTRAMUSCULAR

## 2024-04-14 MED ORDER — DONEPEZIL HCL 5 MG PO TABS
10.0000 mg | ORAL_TABLET | Freq: Every day | ORAL | Status: DC
  Administered 2024-04-14 – 2024-04-17 (×4): 10 mg via ORAL
  Filled 2024-04-14 (×4): qty 2

## 2024-04-14 MED ORDER — ORAL CARE MOUTH RINSE: 15.0000 mL | OROMUCOSAL | Status: DC | PRN

## 2024-04-14 MED ORDER — MELATONIN 3 MG PO TABS
6.0000 mg | ORAL_TABLET | Freq: Every day | ORAL | Status: DC
  Administered 2024-04-14 – 2024-04-17 (×4): 6 mg via ORAL
  Filled 2024-04-14 (×4): qty 2

## 2024-04-14 MED ORDER — ONDANSETRON HCL 4 MG PO TABS: 4.0000 mg | ORAL_TABLET | Freq: Four times a day (QID) | ORAL | Status: DC | PRN

## 2024-04-14 MED ORDER — ACETAMINOPHEN 650 MG RE SUPP
650.0000 mg | Freq: Four times a day (QID) | RECTAL | Status: DC | PRN
Start: 2024-04-14 — End: 2024-04-18

## 2024-04-14 MED ORDER — CITALOPRAM HYDROBROMIDE 20 MG PO TABS
10.0000 mg | ORAL_TABLET | Freq: Every day | ORAL | Status: DC
  Administered 2024-04-15 – 2024-04-17 (×3): 10 mg via ORAL
  Filled 2024-04-14 (×3): qty 1

## 2024-04-14 MED ORDER — LORAZEPAM 2 MG/ML IJ SOLN
2.0000 mg | Freq: Once | INTRAMUSCULAR | Status: AC
  Administered 2024-04-14: 2 mg via INTRAMUSCULAR
  Filled 2024-04-14: qty 1

## 2024-04-14 MED ORDER — MEMANTINE HCL 10 MG PO TABS
10.0000 mg | ORAL_TABLET | Freq: Two times a day (BID) | ORAL | Status: DC
  Administered 2024-04-14 – 2024-04-17 (×7): 10 mg via ORAL
  Filled 2024-04-14 (×6): qty 1

## 2024-04-14 MED ORDER — SODIUM CHLORIDE 0.9 % IV SOLN: INTRAVENOUS | Status: AC

## 2024-04-14 MED ORDER — ENOXAPARIN SODIUM 40 MG/0.4ML IJ SOSY
40.0000 mg | PREFILLED_SYRINGE | INTRAMUSCULAR | Status: DC
  Administered 2024-04-14 – 2024-04-17 (×4): 40 mg via SUBCUTANEOUS
  Filled 2024-04-14 (×4): qty 0.4

## 2024-04-14 MED ORDER — ATORVASTATIN CALCIUM 10 MG PO TABS
10.0000 mg | ORAL_TABLET | Freq: Every day | ORAL | Status: DC
  Administered 2024-04-15 – 2024-04-17 (×3): 10 mg via ORAL
  Filled 2024-04-14 (×3): qty 1

## 2024-04-14 MED ORDER — ACETAMINOPHEN 325 MG PO TABS
650.0000 mg | ORAL_TABLET | Freq: Four times a day (QID) | ORAL | Status: DC | PRN
Start: 2024-04-14 — End: 2024-04-18
  Administered 2024-04-14 – 2024-04-17 (×3): 650 mg via ORAL
  Filled 2024-04-14 (×3): qty 2

## 2024-04-14 NOTE — ED Notes (Signed)
 Patient transported to CT

## 2024-04-14 NOTE — H&P (Signed)
 History and Physical    Patient: Christopher Mccoy FMW:[id number] DOB: 02/12/1946 DOA: 04/13/2024 DOS: the patient was seen and examined on 04/14/2024 PCP: [facility]   Patient coming from: Home  Chief Complaint:  Chief Complaint  Patient presents with   Altered Mental Status   HPI: Christopher Mccoy is a 78 y.o. male with medical history significant of Alzheimer's dementia with behavioral disturbances and hyperlipidemia; who presented to the hospital secondary to changes in mentation and agitation. Family reported symptom has been present for the last 3 days or so and worsening.  They had no recent decrease in patient's oral intake and despite that reporting some frequency without frank dysuria.  Family has also noticed on changes in patient's balances and physical capabilities.  There has not been any fever reported, no chest pain, no nausea, no vomiting, no shortness of breath, no coughing spells, no hematuria, no melena, no hematochezia and no sick contacts.  In the ED CT scan of the head demonstrated no acute intracranial normalities; CT abdomen demonstrating concerns for cystitis.  Cultures taken, fluid provided and antibiotics started; [facility] contacted.  Patient in the hospital for further evaluation and management.  Review of Systems: As mentioned in the history of present illness. All other systems reviewed and are negative. Past Medical History:  Diagnosis Date   Alzheimer's dementia (HCC)    Left arm pain    Past Surgical History:  Procedure Laterality Date   KNEE SURGERY     NM PET IMG ALZHEIMERS     SHOULDER SURGERY     Social History:  reports that he has been smoking cigarettes. He does not have any smokeless tobacco history on file. He reports that he does not currently use alcohol . He reports that he does not currently use drugs.  Allergies  Allergen Reactions   Penicillins     Family History  Problem Relation Age of Onset   Heart failure Mother     Leukemia Father     Prior to Admission medications   Medication Sig Start Date End Date Taking? Authorizing Provider  atorvastatin (LIPITOR) 10 MG tablet Take 10 mg by mouth daily.    [provider]  citalopram (CELEXA) 10 MG tablet Take 10 mg by mouth daily.    [provider]  cyanocobalamin 1000 MCG tablet Take 1,000 mcg by mouth daily.    [provider]  donepezil (ARICEPT) 10 MG tablet Take 10 mg by mouth at bedtime.    [provider]  melatonin 5 MG TABS Take 5 mg by mouth 2 (two) times daily.    [provider]  memantine (NAMENDA) 10 MG tablet Take 10 mg by mouth 2 (two) times daily.    [provider]    Physical Exam: Vitals:   04/13/24 2330 04/14/24 0100 04/14/24 0200 04/14/24 0721  BP: 106/69 92/80 96/65  (!) 115/59  Pulse: 74 90 80 82  Resp: 18 19 (!) 23 20  Temp:  98.5 F (36.9 C)  99 F (37.2 C)  TempSrc:    Axillary  SpO2: 94% 97% 92% 93%  Weight:      Height:       General exam: In no major distress; oriented x 1 intermittently. Respiratory system: Good saturation on room air. Cardiovascular system:RRR.  No rubs or gallops. Gastrointestinal system: Abdomen is nondistended, soft and nontender.  Positive bowel sounds. Central nervous system: Moving 4 limbs spontaneously.  No focal neurological deficits. Extremities: No cyanosis or clubbing. Skin: No  petechiae. Psychiatry: Judgement and insight appear impaired secondary to Dementia.  Data Reviewed: Basic metabolic panel: Sodium 137, potassium 4.3, chloride 103, bicarb 23, BUN 20, creatinine 0.91, GFR >60 and normal LFTs CBC: WBCs 10.8, hemoglobin 13.4 and platelet count 149K Urinalysis: Cloudy in appearance large leukocyte esterase appreciated, moderate hemoglobin urine dipstick and rare bacteria.   Assessment and Plan: 1-acute metabolic encephalopathy -With concern for underlying UTI in a patient with history of dementia. - Provide fluid  resuscitation, continue antibiotics and follow cultures. - Patient is at high risk for delirium. - Continue constant reorientation and supportive care; planning to resume home Seroquel. - Will check B12 to be thorough.  2-UTI - Urinalysis with concerns for presence of UTI and CT abdomen suggesting cystitis - Patient WBC is mildly elevated as well. - Family reporting some frequency but no frank dysuria. - Follow culture results and continue IV antibiotics. - Providing fluid resuscitation to increase flush out process.  3-dementia with behavioral disturbances - Continue Aricept, Namenda, Celexa and the use of Seroquel - Continue supportive care and constant reorientation - Per family reports patient tolerating well dysphagia 3 diet at home.  4-hyperlipidemia - Continue statin.    Advance Care Planning:   Code Status: Limited: Do not attempt resuscitation (DNR) -DNR-LIMITED -Do Not Intubate/DNI    Consults: None  Family Communication: Family at bedside  Severity of Illness: The appropriate patient status for this patient is INPATIENT. Inpatient status is judged to be reasonable and necessary in order to provide the required intensity of service to ensure the patient's safety. The patient's presenting symptoms, physical exam findings, and initial radiographic and laboratory data in the context of their chronic comorbidities is felt to place them at high risk for further clinical deterioration. Furthermore, it is not anticipated that the patient will be medically stable for discharge from the hospital within 2 midnights of admission.   * I certify that at the point of admission it is my clinical judgment that the patient will require inpatient hospital care spanning beyond 2 midnights from the point of admission due to high intensity of service, high risk for further deterioration and high frequency of surveillance required.*  Author: Eric Nunnery, MD 04/14/2024 8:05 AM  For on call  review [email or web address].

## 2024-04-14 NOTE — ED Notes (Signed)
 Attempted to wake pt to give scheduled medication. Pt lethargic and resists medications. Wife at bedside and states pt spit medications last time she attempted to administer. Will attempt to administer again if pt becomes more alert.

## 2024-04-14 NOTE — ED Notes (Signed)
 Pt very aggitated, pulling at IV lines and chords, swinging arms and kicking feet. Dr Theadore made aware new orders received for IM injection as pt has pulled out IV

## 2024-04-15 DIAGNOSIS — F0393 Unspecified dementia, unspecified severity, with mood disturbance: Secondary | ICD-10-CM

## 2024-04-15 DIAGNOSIS — E7800 Pure hypercholesterolemia, unspecified: Secondary | ICD-10-CM

## 2024-04-15 LAB — BASIC METABOLIC PANEL WITH GFR
Anion gap: 9 (ref 5–15)
BUN: 16 mg/dL (ref 8–23)
CO2: 21 mmol/L — ABNORMAL LOW (ref 22–32)
Calcium: 8 mg/dL — ABNORMAL LOW (ref 8.9–10.3)
Chloride: 105 mmol/L (ref 98–111)
Creatinine, Ser: 0.78 mg/dL (ref 0.61–1.24)
GFR, Estimated: >60 mL/min (ref >60)
Glucose, Bld: 100 mg/dL — ABNORMAL HIGH (ref 70–99)
Potassium: 3.4 mmol/L — ABNORMAL LOW (ref 3.5–5.1)
Sodium: 135 mmol/L (ref 135–145)

## 2024-04-15 LAB — CBC
HCT: 39.1 % (ref 39.0–52.0)
Hemoglobin: 13.4 g/dL (ref 13.0–17.0)
MCH: 34.4 pg — ABNORMAL HIGH (ref 26.0–34.0)
MCHC: 34.3 g/dL (ref 30.0–36.0)
MCV: 100.5 fL — ABNORMAL HIGH (ref 80.0–100.0)
Platelets: 132 10*3/uL — ABNORMAL LOW (ref 150–400)
RBC: 3.89 MIL/uL — ABNORMAL LOW (ref 4.22–5.81)
RDW: 12.1 % (ref 11.5–15.5)
WBC: 9.3 10*3/uL (ref 4.0–10.5)
nRBC: 0 % (ref 0.0–0.2)

## 2024-04-15 MED ORDER — POTASSIUM CHLORIDE CRYS ER 20 MEQ PO TBCR
40.0000 meq | EXTENDED_RELEASE_TABLET | ORAL | Status: AC
  Administered 2024-04-15 (×2): 40 meq via ORAL
  Filled 2024-04-15 (×2): qty 2

## 2024-04-15 NOTE — Progress Notes (Signed)
 Patient resting in bed comfortably, granddaughter at bedside.  Clean and dry, male wick in place. Bed alarm on. Patients' granddaughter put 4th bed rail up, understands 4 rail up policy. Call light within reach.

## 2024-04-15 NOTE — Progress Notes (Signed)
 PROGRESS NOTE  Christopher Mccoy, is a 78 y.o. male, DOB - 09/25/1946, FMW:[id number]  Admit date - 04/13/2024   Admitting Physician Posey Maier, DO  Outpatient Primary MD for the patient is [facility]  LOS - 1  Chief Complaint  Patient presents with   Altered Mental Status      Brief Narrative:  78 y.o. male with medical history significant of Alzheimer's dementia with behavioral disturbances and hyperlipidemia;     -Assessment and Plan: No notes have been filed under this hospital service. Service: Hospitalist     *** Status is: Inpatient ***  Disposition: The patient is from: {From:23814}              Anticipated d/c is to: {To:23815}              Anticipated d/c date is: {Days:23816}              Patient currently {Medically stable:23817} Barriers: Not Clinically Stable- ***  Code Status : *** -  Code Status: Limited: Do not attempt resuscitation (DNR) -DNR-LIMITED -Do Not Intubate/DNI    Family Communication:   *** NA (patient is alert, awake and coherent) ***  DVT Prophylaxis  :   - SCDs ******  enoxaparin  (LOVENOX ) injection 40 mg Start: 04/14/24 0800   Lab Results  Component Value Date   PLT 132 (L) 04/15/2024    Inpatient Medications  Scheduled Meds:  atorvastatin   10 mg Oral Daily   citalopram   10 mg Oral Daily   cyanocobalamin  1,000 mcg Oral Daily   donepezil   10 mg Oral QHS   enoxaparin  (LOVENOX ) injection  40 mg Subcutaneous Q24H   melatonin  6 mg Oral QHS   memantine   10 mg Oral BID   QUEtiapine   50 mg Oral BID   Continuous Infusions:  cefTRIAXone  (ROCEPHIN )  IV 1 g (04/15/24 0446)   PRN Meds:.acetaminophen  **OR** acetaminophen , haloperidol  lactate, ondansetron  **OR** ondansetron  (ZOFRAN ) IV, mouth rinse   Anti-infectives (From admission, onward)    Start     Dose/Rate Route Frequency Ordered Stop   04/15/24 0600  cefTRIAXone  (ROCEPHIN ) 1 g in sodium chloride  0.9 % 100 mL IVPB        1 g 200 mL/hr over 30 Minutes  Intravenous Every 24 hours 04/14/24 0816     04/14/24 0215  cefTRIAXone  (ROCEPHIN ) 2 g in sodium chloride  0.9 % 100 mL IVPB        2 g 200 mL/hr over 30 Minutes Intravenous  Once 04/14/24 0207 04/14/24 0251         Subjective: Jaystin Mcgarvey today has no fevers, no emesis,  No chest pain,  ***  Wife at bedside, questions answered  Objective: Vitals:   04/14/24 2209 04/15/24 0625 04/15/24 0627 04/15/24 1259  BP: 102/65 118/65  (!) 97/58  Pulse: 64  89 78  Resp: 16  19 20   Temp: 98.6 F (37 C)  99.4 F (37.4 C) 98.7 F (37.1 C)  TempSrc: Axillary  Oral Axillary  SpO2:   99% 94%  Weight:      Height:        Intake/Output Summary (Last 24 hours) at 04/15/2024 2025 Last data filed at 04/15/2024 1500 Gross per 24 hour  Intake 100.06 ml  Output 400 ml  Net -299.94 ml   Filed Weights   04/13/24 2105 04/14/24 1310  Weight: 93 kg 87.3 kg    Physical Exam  Gen:- Awake Alert,  *** HEENT:- .AT, No sclera icterus  Neck-Supple Neck,No JVD,.  Lungs-  CTAB , fair symmetrical air movement CV- S1, S2 normal, regular  Abd-  +ve B.Sounds, Abd Soft, No tenderness,    Extremity/Skin:- No  edema, pedal pulses present *** Psych-affect is appropriate, oriented x3 Neuro-no new focal deficits, no tremors  Data Reviewed: I have personally reviewed following labs and imaging studies  CBC: Recent Labs  Lab 04/11/24 2241 04/13/24 2326 04/14/24 0820 04/15/24 0421  WBC 5.5 10.8* 8.9 9.3  NEUTROABS 2.5  --   --   --   HGB 14.8 13.4 13.2 13.4  HCT 43.2 39.6 38.1* 39.1  MCV 102.4* 101.8* 100.8* 100.5*  PLT 210 149* 141* 132*   Basic Metabolic Panel: Recent Labs  Lab 04/11/24 2241 04/13/24 2326 04/14/24 0820 04/15/24 0421  NA 141 137  --  135  K 4.3 4.3  --  3.4*  CL 106 103  --  105  CO2 24 23  --  21*  GLUCOSE 121* 134*  --  100*  BUN 23 20  --  16  CREATININE 0.96 0.91 0.77 0.78  CALCIUM  8.9 8.6*  --  8.0*  MG  --   --  1.7  --    GFR: Estimated Creatinine  Clearance: 82.4 mL/min (by C-G formula based on SCr of 0.78 mg/dL). Liver Function Tests: Recent Labs  Lab 04/11/24 2241 04/13/24 2326  AST 20 22  ALT 19 22  ALKPHOS 74 66  BILITOT 0.6 1.6*  PROT 6.8 6.5  ALBUMIN 3.8 3.4*   Cardiac Enzymes: No results for input(s): CKTOTAL, CKMB, CKMBINDEX, TROPONINI in the last 168 hours. BNP (last 3 results) No results for input(s): PROBNP in the last 8760 hours. HbA1C: No results for input(s): HGBA1C in the last 72 hours. Sepsis Labs: @LABRCNTIP (procalcitonin:4,lacticidven:4) )No results found for this or any previous visit (from the past 240 hours).    Radiology Studies: CT HEAD WO CONTRAST ( ) Result Date: 04/14/2024 CLINICAL DATA:  Clemens and hit head last week, with increasing confusion since then. Previous history of Alzheimer's dementia. Caretakers report darkening urine. Question UTI. The patient has been guarding h the at the is abdomen which the caretakers interpreted is abdominal pain. EXAM: CT HEAD WITHOUT CONTRAST CT ABDOMEN AND PELVIS WITHOUT CONTRAST TECHNIQUE: Contiguous axial images were obtained from the base of the skull through the vertex without intravenous contrast. Multiplanar CT image reconstructions were also generated. Multidetector CT imaging of the abdomen and pelvis was performed following the standard protocol without IV contrast. RADIATION DOSE REDUCTION: This exam was performed according to the departmental dose-optimization program which includes automated exposure control, adjustment of the mA and/or kV according to patient size and/or use of iterative reconstruction technique. COMPARISON:  Head CT recently 04/11/2024, head CT report with images unavailable from 11/29/2022. No prior abdominal films or imaging. FINDINGS: CT HEAD FINDINGS Brain: There is mild-to-moderate cerebral atrophy with atrophic ventriculomegaly and mild small vessel disease of the cerebral white matter. The cerebellum and brainstem are  unremarkable. No cortical based acute infarct, hemorrhage, mass or mass effect are seen. There is no midline shift.  The basal cisterns are clear. Vascular: There are calcific plaques in both siphons. No hyperdense central vasculature is seen. Skull: Negative for depressed fractures or focal lesions. No visible scalp hematoma. Sinuses/Orbits: Bilateral symmetric proptosis with otherwise negative orbits. There is patchy membrane thickening in the ethmoids. Other paranasal sinuses are clear. Mild fluid in the right mastoid tip is again noted with the mastoid air cells otherwise clear. Nasal  septum deviates left. There are bilateral wax impactions in the external auditory canals. Other: None. CT ABDOMEN AND PELVIS FINDINGS Technical note: Patient was unwilling to lie flat for the study. Positioning for this exam is right lateral decubitus. Lower chest: There is marked elevation of the right hemidiaphragm consistent with eventration or paresis, with overlying atelectasis. Lung bases are otherwise clear. There is mild bronchial thickening in the lower lobes. The cardiac size is normal. Coronary arteries are heavily calcified. Hepatobiliary: No focal liver abnormality is seen without contrast. No gallstones, gallbladder wall thickening, or biliary dilatation. Pancreas: No abnormality is seen without contrast. Spleen: No abnormality is seen without contrast. Adrenals/Urinary Tract: There is no adrenal mass, no contour deforming abnormality of the unenhanced kidneys. Symmetric perinephric stranding change, typically chronic but without old studies chronicity is indeterminate and possible this could be inflammatory or from pyelonephritis. There is a 1 mm nonobstructive caliceal stone in the superior pole right kidney, 2 mm caliceal stone in the superior pole of the left. No other nephrolithiasis is seen. There is no hydronephrosis or ureteral stone. The bladder is thickened with perivesical stranding concerning for cystitis.  Some of the thickening could in part be due to chronic bladder outlet obstruction as the prostate is significantly enlarged measuring 6.1 cm. Stomach/Bowel: Unremarkable contracted stomach and unopacified small bowel. An appendix is not seen. There is mild fecal stasis. There is colonic interposition of the hepatic flexure between the liver and abdominal wall. Uncomplicated sigmoid diverticulosis. No evidence of acute colitis or diverticulitis. Vascular/Lymphatic: Aortic atherosclerosis. No enlarged abdominal or pelvic lymph nodes. Reproductive: Enlarged prostate, dystrophic calcifications centrally. As above measures 6.1 cm transverse axis. Both testicles are in the scrotal sac. Other: There is a small left inguinal fat hernia. No incarcerated hernia. Trace presacral ascites. No free hemorrhage, free air or abscess. Musculoskeletal: Mild levoscoliosis and degenerative changes of the spine. Osteopenia. Acquired spinal stenosis L4-5. IMPRESSION: 1. No acute intracranial CT findings or depressed skull fractures. 2. Atrophy and small-vessel disease. 3. Bilateral proptosis. 4. Mild ethmoid sinus membrane thickening. 5. Bilateral wax impactions in the external auditory canals. 6. Marked elevation of the right hemidiaphragm consistent with eventration or paresis, with overlying atelectasis. 7. Aortic and coronary artery atherosclerosis. 8. Constipation and diverticulosis. 9. Enlarged prostate with bladder thickening and perivesical stranding concerning for cystitis. There could also be a chronic bladder outlet problem. 10. Nonobstructive nephrolithiasis. 11. Trace presacral ascites. 12. Osteopenia and degenerative change. Acquired spinal stenosis L4-5. Aortic Atherosclerosis (ICD10-I70.0). Electronically Signed   By: Francis Quam M.D.   On: 04/14/2024 01:11   CT ABDOMEN PELVIS WO CONTRAST Result Date: 04/14/2024 CLINICAL DATA:  Clemens and hit head last week, with increasing confusion since then. Previous history of  Alzheimer's dementia. Caretakers report darkening urine. Question UTI. The patient has been guarding h the at the is abdomen which the caretakers interpreted is abdominal pain. EXAM: CT HEAD WITHOUT CONTRAST CT ABDOMEN AND PELVIS WITHOUT CONTRAST TECHNIQUE: Contiguous axial images were obtained from the base of the skull through the vertex without intravenous contrast. Multiplanar CT image reconstructions were also generated. Multidetector CT imaging of the abdomen and pelvis was performed following the standard protocol without IV contrast. RADIATION DOSE REDUCTION: This exam was performed according to the departmental dose-optimization program which includes automated exposure control, adjustment of the mA and/or kV according to patient size and/or use of iterative reconstruction technique. COMPARISON:  Head CT recently 04/11/2024, head CT report with images unavailable from 11/29/2022. No prior abdominal films  or imaging. FINDINGS: CT HEAD FINDINGS Brain: There is mild-to-moderate cerebral atrophy with atrophic ventriculomegaly and mild small vessel disease of the cerebral white matter. The cerebellum and brainstem are unremarkable. No cortical based acute infarct, hemorrhage, mass or mass effect are seen. There is no midline shift.  The basal cisterns are clear. Vascular: There are calcific plaques in both siphons. No hyperdense central vasculature is seen. Skull: Negative for depressed fractures or focal lesions. No visible scalp hematoma. Sinuses/Orbits: Bilateral symmetric proptosis with otherwise negative orbits. There is patchy membrane thickening in the ethmoids. Other paranasal sinuses are clear. Mild fluid in the right mastoid tip is again noted with the mastoid air cells otherwise clear. Nasal septum deviates left. There are bilateral wax impactions in the external auditory canals. Other: None. CT ABDOMEN AND PELVIS FINDINGS Technical note: Patient was unwilling to lie flat for the study. Positioning  for this exam is right lateral decubitus. Lower chest: There is marked elevation of the right hemidiaphragm consistent with eventration or paresis, with overlying atelectasis. Lung bases are otherwise clear. There is mild bronchial thickening in the lower lobes. The cardiac size is normal. Coronary arteries are heavily calcified. Hepatobiliary: No focal liver abnormality is seen without contrast. No gallstones, gallbladder wall thickening, or biliary dilatation. Pancreas: No abnormality is seen without contrast. Spleen: No abnormality is seen without contrast. Adrenals/Urinary Tract: There is no adrenal mass, no contour deforming abnormality of the unenhanced kidneys. Symmetric perinephric stranding change, typically chronic but without old studies chronicity is indeterminate and possible this could be inflammatory or from pyelonephritis. There is a 1 mm nonobstructive caliceal stone in the superior pole right kidney, 2 mm caliceal stone in the superior pole of the left. No other nephrolithiasis is seen. There is no hydronephrosis or ureteral stone. The bladder is thickened with perivesical stranding concerning for cystitis. Some of the thickening could in part be due to chronic bladder outlet obstruction as the prostate is significantly enlarged measuring 6.1 cm. Stomach/Bowel: Unremarkable contracted stomach and unopacified small bowel. An appendix is not seen. There is mild fecal stasis. There is colonic interposition of the hepatic flexure between the liver and abdominal wall. Uncomplicated sigmoid diverticulosis. No evidence of acute colitis or diverticulitis. Vascular/Lymphatic: Aortic atherosclerosis. No enlarged abdominal or pelvic lymph nodes. Reproductive: Enlarged prostate, dystrophic calcifications centrally. As above measures 6.1 cm transverse axis. Both testicles are in the scrotal sac. Other: There is a small left inguinal fat hernia. No incarcerated hernia. Trace presacral ascites. No free  hemorrhage, free air or abscess. Musculoskeletal: Mild levoscoliosis and degenerative changes of the spine. Osteopenia. Acquired spinal stenosis L4-5. IMPRESSION: 1. No acute intracranial CT findings or depressed skull fractures. 2. Atrophy and small-vessel disease. 3. Bilateral proptosis. 4. Mild ethmoid sinus membrane thickening. 5. Bilateral wax impactions in the external auditory canals. 6. Marked elevation of the right hemidiaphragm consistent with eventration or paresis, with overlying atelectasis. 7. Aortic and coronary artery atherosclerosis. 8. Constipation and diverticulosis. 9. Enlarged prostate with bladder thickening and perivesical stranding concerning for cystitis. There could also be a chronic bladder outlet problem. 10. Nonobstructive nephrolithiasis. 11. Trace presacral ascites. 12. Osteopenia and degenerative change. Acquired spinal stenosis L4-5. Aortic Atherosclerosis (ICD10-I70.0). Electronically Signed   By: Francis Quam M.D.   On: 04/14/2024 01:11   DG Chest Port 1 View Result Date: 04/13/2024 CLINICAL DATA:  Altered mental status. EXAM: PORTABLE CHEST 1 VIEW COMPARISON:  Portable chest 04/11/2024. FINDINGS: The cardiac size is normal. No vascular congestion is seen. Mediastinum is  normally outlined. There is calcification in the transverse aorta. Low lung volumes. Limited view of lung bases especially on the right where the diaphragm is again noted elevated. The visualized lungs are clear of infiltrates. Right perihilar linear atelectasis is again shown. No new or acute osseous findings. IMPRESSION: 1. Low lung volumes with right perihilar linear atelectasis. No evidence of acute chest disease as far as seen. 2. Aortic atherosclerosis. Electronically Signed   By: Francis Quam M.D.   On: 04/13/2024 23:32     Scheduled Meds:  atorvastatin   10 mg Oral Daily   citalopram   10 mg Oral Daily   cyanocobalamin  1,000 mcg Oral Daily   donepezil   10 mg Oral QHS   enoxaparin  (LOVENOX )  injection  40 mg Subcutaneous Q24H   melatonin  6 mg Oral QHS   memantine   10 mg Oral BID   QUEtiapine   50 mg Oral BID   Continuous Infusions:  cefTRIAXone  (ROCEPHIN )  IV 1 g (04/15/24 0446)     LOS: 1 day    Rendall Carwin M.D on 04/15/2024 at 8:25 PM  Go to [email or web address] - for contact info  [facility] - Office  [phone]  If 7PM-7AM, please contact night-coverage [email or web address] 04/15/2024, 8:25 PM

## 2024-04-15 NOTE — Progress Notes (Signed)
   04/15/24 1454  TOC Brief Assessment  Insurance and Status Reviewed  Patient has primary care physician Yes  Home environment has been reviewed From home c/family  Prior level of function: Assisted  Prior/Current Home Services No current home services  Social Drivers of Health Review SDOH reviewed no interventions necessary (Pt c/Alz/dementia showing as current smoker.)  Readmission risk has been reviewed Yes  Transition of care needs no transition of care needs at this time   Transition of Care Department [facility] has reviewed patient and no TOC needs have been identified at this time. We will continue to monitor patient advancement through interdisciplinary progression rounds. If new patient transition needs arise, please place a TOC consult.

## 2024-04-15 NOTE — Plan of Care (Signed)
   Problem: Activity: Goal: Risk for activity intolerance will decrease Outcome: Progressing   Problem: Safety: Goal: Ability to remain free from injury will improve Outcome: Progressing   Problem: Skin Integrity: Goal: Risk for impaired skin integrity will decrease Outcome: Progressing

## 2024-04-15 NOTE — Progress Notes (Signed)
 PT Cancellation Note  Patient Details Name: Christopher Mccoy MRN: [id number] DOB: 01-Feb-1946   Cancelled Treatment:    Reason Eval/Treat Not Completed: Other (comment);Medical issues which prohibited therapy (pt found combative with nursing staff and not following commands. Pt is very confused and aggressive, PT will attempt to evaluate when pt calms.)   Lang Ada, PT, DPT [facility] Office: [phone] 4:35 PM, 04/15/24

## 2024-04-16 DIAGNOSIS — F02C11 Dementia in other diseases classified elsewhere, severe, with agitation: Secondary | ICD-10-CM

## 2024-04-16 MED ORDER — QUETIAPINE FUMARATE ER 50 MG PO TB24
50.0000 mg | ORAL_TABLET | Freq: Every day | ORAL | Status: DC
  Administered 2024-04-17: 50 mg via ORAL
  Filled 2024-04-16: qty 1

## 2024-04-16 MED ORDER — HALOPERIDOL LACTATE 5 MG/ML IJ SOLN
2.0000 mg | Freq: Two times a day (BID) | INTRAMUSCULAR | Status: DC | PRN
  Administered 2024-04-17: 2 mg via INTRAMUSCULAR
  Filled 2024-04-16: qty 1

## 2024-04-16 MED ORDER — QUETIAPINE FUMARATE ER 50 MG PO TB24
100.0000 mg | ORAL_TABLET | Freq: Every day | ORAL | Status: DC
  Administered 2024-04-17: 100 mg via ORAL
  Filled 2024-04-16: qty 2

## 2024-04-16 MED ORDER — QUETIAPINE FUMARATE ER 50 MG PO TB24
50.0000 mg | ORAL_TABLET | Freq: Once | ORAL | Status: AC
  Administered 2024-04-17: 50 mg via ORAL
  Filled 2024-04-16: qty 1

## 2024-04-16 NOTE — Progress Notes (Signed)
 Notified wife romero of patient being in restraints.

## 2024-04-16 NOTE — Progress Notes (Signed)
 Patient started yelling and becoming aggressive.  Went in room to assist his primary nurse with giving haldol . Patient started swinging at my face. He is not redirectable. Ordered received to place soft wrist restrains on patient for staff safety at this time.

## 2024-04-16 NOTE — Progress Notes (Signed)
 PROGRESS NOTE  Christopher Mccoy, is a 78 y.o. male, DOB - 14-May-1946, FMW:[id number]  Admit date - 04/13/2024   Admitting Physician Posey Maier, DO  Outpatient Primary MD for the patient is [facility]  LOS - 2  Chief Complaint  Patient presents with   Altered Mental Status      Brief Narrative:  78 y.o. male with medical history significant of Alzheimer's dementia with behavioral disturbances and hyperlipidemia admitted on 04/14/2024 with acute metabolic cephalopathy superimposed underlying dementia due to presumed UTI    -Assessment and Plan: 1-acute metabolic encephalopathy--suspect due to underlying UTI --Patient with hospital related delirium - Continue constant reorientation and supportive care; p -Adjust PTA Seroquel  due to ongoing agitation okay to increase to 100 mg nightly and 50 mg qam from 50 twice daily prior to admission - B12 level is not low - 2) E. coli UTI--UA findings and CT abdomen and pelvis findings suggest UTI -Continue IV Rocephin  pending urine culture data   3-dementia with behavioral disturbances - Continue Aricept , Namenda , Celexa  and Seroquel  - Continue supportive care and constant reorientation - Per family reports patient tolerating well dysphagia 3 diet at home.   4-hyperlipidemia - Continue statin.  5)H/o chronic Diastolic dysfunction congestive heart failure  - Appears stable at this time with no volume overload concerns  6)Generalized weakness and ambulatory dysfunction/recurrent falls--- --PT eval requested -- Patient is agitated and refused to work with physical therapist today - Will attempt again in a.m.  Status is: Inpatient   Disposition: The patient is from: Home              Anticipated d/c is to: Home with [facility]              Anticipated d/c date is: 1 day              Patient currently is not medically stable to d/c. Barriers: Not Clinically Stable-   Code Status :  -  Code Status: Limited: Do not attempt  resuscitation (DNR) -DNR-LIMITED -Do Not Intubate/DNI    Family Communication: Discussed with wife at bedside  DVT Prophylaxis  :   - SCDs  enoxaparin  (LOVENOX ) injection 40 mg Start: 04/14/24 0800   Lab Results  Component Value Date   PLT 132 (L) 04/15/2024    Inpatient Medications  Scheduled Meds:  atorvastatin   10 mg Oral Daily   citalopram   10 mg Oral Daily   cyanocobalamin  1,000 mcg Oral Daily   donepezil   10 mg Oral QHS   enoxaparin  (LOVENOX ) injection  40 mg Subcutaneous Q24H   melatonin  6 mg Oral QHS   memantine   10 mg Oral BID   QUEtiapine   50 mg Oral BID   Continuous Infusions:  cefTRIAXone  (ROCEPHIN )  IV 1 g (04/16/24 0523)   PRN Meds:.acetaminophen  **OR** acetaminophen , haloperidol  lactate, ondansetron  **OR** ondansetron  (ZOFRAN ) IV, mouth rinse   Anti-infectives (From admission, onward)    Start     Dose/Rate Route Frequency Ordered Stop   04/15/24 0600  cefTRIAXone  (ROCEPHIN ) 1 g in sodium chloride  0.9 % 100 mL IVPB        1 g 200 mL/hr over 30 Minutes Intravenous Every 24 hours 04/14/24 0816     04/14/24 0215  cefTRIAXone  (ROCEPHIN ) 2 g in sodium chloride  0.9 % 100 mL IVPB        2 g 200 mL/hr over 30 Minutes Intravenous  Once 04/14/24 0207 04/14/24 0251         Subjective:  Christopher Mccoy today has no fevers, no emesis,  No chest pain,    Wife at bedside, questions answered -- Patient is agitated and refused to work with physical therapist today - PT will attempt to evaluate him again in a.m.  Objective: Vitals:   04/15/24 0627 04/15/24 1259 04/16/24 0519 04/16/24 1436  BP:  (!) 97/58 100/87 128/70  Pulse: 89 78  77  Resp: 19 20  16   Temp: 99.4 F (37.4 C) 98.7 F (37.1 C)  97.7 F (36.5 C)  TempSrc: Oral Axillary  Oral  SpO2: 99% 94%  97%  Weight:      Height:        Intake/Output Summary (Last 24 hours) at 04/16/2024 2121 Last data filed at 04/16/2024 1715 Gross per 24 hour  Intake 120 ml  Output 500 ml  Net -380 ml   Filed  Weights   04/13/24 2105 04/14/24 1310  Weight: 93 kg 87.3 kg    Physical Exam  Gen:- Awake Alert, pleasantly confused and restless at times HEENT:- [place].AT, No sclera icterus Neck-Supple Neck,No JVD,.  Lungs-  CTAB , fair symmetrical air movement CV- S1, S2 normal, regular  Abd-  +ve B.Sounds, Abd Soft, No tenderness, no CVA tenderness    Extremity/Skin:- No  edema, pedal pulses present  Psych-significant cognitive and memory deficits consistent with underlying dementia neuro-generalized weakness, no new focal deficits, no tremors  Data Reviewed: I have personally reviewed following labs and imaging studies  CBC: Recent Labs  Lab 04/11/24 2241 04/13/24 2326 04/14/24 0820 04/15/24 0421  WBC 5.5 10.8* 8.9 9.3  NEUTROABS 2.5  --   --   --   HGB 14.8 13.4 13.2 13.4  HCT 43.2 39.6 38.1* 39.1  MCV 102.4* 101.8* 100.8* 100.5*  PLT 210 149* 141* 132*   Basic Metabolic Panel: Recent Labs  Lab 04/11/24 2241 04/13/24 2326 04/14/24 0820 04/15/24 0421  NA 141 137  --  135  K 4.3 4.3  --  3.4*  CL 106 103  --  105  CO2 24 23  --  21*  GLUCOSE 121* 134*  --  100*  BUN 23 20  --  16  CREATININE 0.96 0.91 0.77 0.78  CALCIUM  8.9 8.6*  --  8.0*  MG  --   --  1.7  --    GFR: Estimated Creatinine Clearance: 82.4 mL/min (by C-G formula based on SCr of 0.78 mg/dL). Liver Function Tests: Recent Labs  Lab 04/11/24 2241 04/13/24 2326  AST 20 22  ALT 19 22  ALKPHOS 74 66  BILITOT 0.6 1.6*  PROT 6.8 6.5  ALBUMIN 3.8 3.4*   Scheduled Meds:  atorvastatin   10 mg Oral Daily   citalopram   10 mg Oral Daily   cyanocobalamin  1,000 mcg Oral Daily   donepezil   10 mg Oral QHS   enoxaparin  (LOVENOX ) injection  40 mg Subcutaneous Q24H   melatonin  6 mg Oral QHS   memantine   10 mg Oral BID   QUEtiapine   50 mg Oral BID   Continuous Infusions:  cefTRIAXone  (ROCEPHIN )  IV 1 g (04/16/24 0523)    LOS: 2 days    Rendall Carwin M.D on 04/16/2024 at 9:21 PM  Go to [email or web address] - for  contact info  [facility] - Office  [phone]  If 7PM-7AM, please contact night-coverage [email or web address] 04/16/2024, 9:21 PM

## 2024-04-16 NOTE — Plan of Care (Signed)

## 2024-04-16 NOTE — Progress Notes (Signed)
 PT  Patient remains combative.  Is not receptive to therapy.  Montie Metro, PT CLT [phone]

## 2024-04-16 NOTE — Progress Notes (Signed)
 Therapist was contacted by nursing that they wanted therapist to try to complete the evaluation  a second time.  Pt is in restraints as therapist enters room.  Pt is resistant to all movement and is punching at therapist.  PT is not able to evaluate this patient which is very obvious.   Montie Metro, PT CLT [phone]

## 2024-04-17 DIAGNOSIS — E7849 Other hyperlipidemia: Secondary | ICD-10-CM

## 2024-04-17 LAB — URINE CULTURE: Culture: >=100000 — AB

## 2024-04-17 MED ORDER — LORAZEPAM 1 MG PO TABS
1.0000 mg | ORAL_TABLET | Freq: Once | ORAL | Status: DC
  Filled 2024-04-17: qty 1

## 2024-04-17 MED ORDER — LORAZEPAM 2 MG/ML IJ SOLN
1.0000 mg | Freq: Once | INTRAMUSCULAR | Status: AC
  Administered 2024-04-17: 1 mg via INTRAMUSCULAR
  Filled 2024-04-17: qty 1

## 2024-04-17 MED ORDER — QUETIAPINE FUMARATE 50 MG PO TABS
50.0000 mg | ORAL_TABLET | ORAL | 3 refills | Status: DC
Start: 2024-04-17 — End: 2024-05-10

## 2024-04-17 MED ORDER — CEPHALEXIN 500 MG PO CAPS: 500.0000 mg | ORAL_CAPSULE | Freq: Three times a day (TID) | ORAL | 0 refills | Status: AC

## 2024-04-17 MED ORDER — SODIUM CHLORIDE 0.9 % IV SOLN: INTRAVENOUS | Status: DC | PRN

## 2024-04-17 MED ORDER — DONEPEZIL HCL 10 MG PO TABS: 10.0000 mg | ORAL_TABLET | Freq: Every day | ORAL | 3 refills | Status: DC

## 2024-04-17 MED ORDER — SENNOSIDES-DOCUSATE SODIUM 8.6-50 MG PO TABS: 2.0000 | ORAL_TABLET | Freq: Every day | ORAL | 2 refills | Status: DC

## 2024-04-17 MED ORDER — MEMANTINE HCL 10 MG PO TABS: 10.0000 mg | ORAL_TABLET | Freq: Two times a day (BID) | ORAL | 3 refills | Status: AC

## 2024-04-17 MED ORDER — QUETIAPINE FUMARATE ER 50 MG PO TB24: 100.0000 mg | ORAL_TABLET | Freq: Every day | ORAL | 2 refills | Status: DC

## 2024-04-17 MED ORDER — BISACODYL 10 MG RE SUPP: 10.0000 mg | RECTAL | 0 refills | Status: DC | PRN

## 2024-04-17 MED ORDER — ACETAMINOPHEN 325 MG PO TABS: 650.0000 mg | ORAL_TABLET | Freq: Four times a day (QID) | ORAL | Status: AC | PRN

## 2024-04-17 MED ORDER — CITALOPRAM HYDROBROMIDE 20 MG PO TABS: 20.0000 mg | ORAL_TABLET | Freq: Every day | ORAL | 2 refills | Status: AC

## 2024-04-17 NOTE — Progress Notes (Signed)
 Patient still hitting at staff and kicking, trying to get OOB , Dr. Rendall ordered IM Ativan  1mg  given in right deltoid patient attempting to kick this writer at time of administration , given with help of NT in room. Bed in lowest position , will continue to monitor .

## 2024-04-17 NOTE — Progress Notes (Signed)
 Therapist attempted evaluation once again.  Pt remains resistive and combative to therapy. Montie Metro, PT CLT [phone]

## 2024-04-17 NOTE — Discharge Instructions (Signed)
 1)Fall Precautions--- 2)Encourage adequate oral intake--- push fluids, avoid dehydration 3) please take cephalexin/Keflex antibiotic as prescribed for E. coli UTI

## 2024-04-17 NOTE — TOC Transition Note (Signed)
 Transition of Care [facility]) - Discharge Note  Patient Details  Name: Barnes Florek MRN: [id number] Date of Birth: [birth year]  [facility] CM/SW Contact:  Nena LITTIE Coffee, RN Phone Number: 04/17/2024, 11:02 AM  Clinical Narrative:    Pt to dc home c/wife via [facility] transport. Wife states that [facility] is [facility] c[facility]. Wife understands that if DME is needed after dc she will need to contact the [place] Monday-Friday.   Final next level of care: Home w Home Health Services ([facility] is already providing [facility] services through [place].) Barriers to Discharge: Barriers Resolved  Patient Goals and CMS Choice   Discharge Placement   Discharge Plan and Services Additional resources added to the After Visit Summary for         DME Arranged:  (Spoke c/wife at bedside. She verbalized understanding that she will need to contact the [facility] regarding DME. [facility] is primary ins and [facility] contacts are not available on the weekends.)   Social Drivers of Health (SDOH) Interventions SDOH Screenings   Food Insecurity: No Food Insecurity (04/14/2024)  Housing: Low Risk  (04/14/2024)  Transportation Needs: No Transportation Needs (04/14/2024)  Utilities: Not At Risk (04/14/2024)  Social Connections: Unknown (04/14/2024)  Tobacco Use: High Risk (04/13/2024)   Readmission Risk Interventions    04/15/2024    2:53 PM  Readmission Risk Prevention Plan  Post Dischage Appt Complete  Medication Screening Complete  Transportation Screening Complete

## 2024-04-17 NOTE — Discharge Summary (Addendum)
 Christopher Mccoy, is a 78 y.o. male  DOB February 17, 1946  MRN [id number].  Admission date:  04/13/2024  Admitting Physician  Posey Maier, DO  Discharge Date:  04/17/2024   Primary MD  Center[facility]  Recommendations for primary care physician for things to follow:  1)Fall Precautions--- 2)Encourage adequate oral intake--- push fluids, avoid dehydration 3) please take cephalexin/Keflex antibiotic as prescribed for E. coli UTI  Admission Diagnosis  UTI (urinary tract infection) [N39.0] Acute cystitis without hematuria [N30.00] Altered mental status, unspecified altered mental status type [R41.82]  Discharge Diagnosis  UTI (urinary tract infection) [N39.0] Acute cystitis without hematuria [N30.00] Altered mental status, unspecified altered mental status type [R41.82]    Principal Problem:   UTI (urinary tract infection) Active Problems:   Dementia (HCC)   Hyperlipidemia      Past Medical History:  Diagnosis Date   Alzheimer's dementia (HCC)    Left arm pain     Past Surgical History:  Procedure Laterality Date   KNEE SURGERY     NM PET IMG ALZHEIMERS     SHOULDER SURGERY       HPI  from the history and physical done on the day of admission:   HPI: Christopher Mccoy is a 78 y.o. male with medical history significant of Alzheimer's dementia with behavioral disturbances and hyperlipidemia; who presented to the hospital secondary to changes in mentation and agitation. Family reported symptom has been present for the last 3 days or so and worsening.  They had no recent decrease in patient's oral intake and despite that reporting some frequency without frank dysuria.  Family has also noticed on changes in patient's balances and physical capabilities.   There has not been any fever reported, no chest pain, no nausea, no vomiting, no shortness of breath, no coughing spells, no hematuria, no melena, no  hematochezia and no sick contacts.   In the ED CT scan of the head demonstrated no acute intracranial normalities; CT abdomen demonstrating concerns for cystitis.   Cultures taken, fluid provided and antibiotics started; [facility] contacted.  Patient in the hospital for further evaluation and management.   Review of Systems: As mentioned in the history of present illness. All other systems reviewed and are negative.    Hospital Course:     Brief Narrative:  78 y.o. male with medical history significant of Alzheimer's dementia with behavioral disturbances and hyperlipidemia admitted on 04/14/2024 with acute metabolic cephalopathy superimposed underlying dementia due to E. coli UTI     -Assessment and Plan: 1-acute metabolic encephalopathy---due to E. coli UTI - Continue constant reorientation and supportive care; p -Adjust PTA Seroquel  due to ongoing agitation okay to increase to 100 mg nightly and 50 mg qam from 50 twice daily prior to admission - B12 level is not low --Sadly at baseline patient has advanced dementia with significant cognitive and memory deficits and behavioral disturbance - 2) E. coli UTI--UA findings and CT abdomen and pelvis findings suggest UTI --Treated with IV Rocephin  okay to discharge on Keflex per sensitivity  3-dementia with behavioral disturbances - Continue Aricept , Namenda , Celexa   - Continue supportive care and constant reorientation - Per family reports patient tolerating well dysphagia 3 diet at home. -Seroquel  adjusted as above in #1   4-hyperlipidemia - Risk Vs benefit discussed with patient's wife, okay to discontinue statin drug   5)H/o chronic Diastolic dysfunction congestive heart failure  - Appears stable at this time with no volume overload concerns   6)Generalized weakness and ambulatory dysfunction/recurrent falls--- -physical therapist attempted to work with patient on 04/16/2024 and again on 04/17/2024 without much success due to  dementia -- 7)Disposition:- Patient wife does Not want placement to memory unit/long-term placement at this time -She will work with the [employer] social worker to get additional help for patient at home to help manage his dementia related behavioral issues --DME including hospital bed, wheelchair and 3 N 1 commode ordered for patient -- Discussed possibly transitioning to Hospice/Comfort care with patient's wife, she is Not ready to transition to Hospice/Palliative/Comfort Care at this time   Disposition: The patient is from: Home              Anticipated d/c is to: Home with [facility]  Discharge Condition: stable  Follow UP   Follow-up Information     Center, [facility]. Schedule an appointment as soon as possible for a visit in 1 week(s).   Contact information: [address] [place] [address] [phone]                Diet and Activity recommendation:  As advised  Discharge Instructions    Discharge Instructions     Call MD for:  difficulty breathing, headache or visual disturbances   Complete by: As directed    Call MD for:  persistant dizziness or light-headedness   Complete by: As directed    Call MD for:  persistant nausea and vomiting   Complete by: As directed    Call MD for:  temperature >100.4   Complete by: As directed    Diet general   Complete by: As directed    Discharge instructions   Complete by: As directed    1)Fall Precautions--- 2)Encourage adequate oral intake--- push fluids, avoid dehydration 3) please take cephalexin/Keflex antibiotic as prescribed for E. coli UTI   Increase activity slowly   Complete by: As directed        Discharge Medications     Allergies as of 04/17/2024       Reactions   Penicillins Anaphylaxis        Medication List     STOP taking these medications    melatonin 5 MG Tabs   QUEtiapine  50 MG tablet Commonly known as: SEROQUEL  Replaced by: QUEtiapine  50 MG Tb24 24 hr tablet       TAKE these  medications    acetaminophen  325 MG tablet Commonly known as: TYLENOL  Take 2 tablets (650 mg total) by mouth every 6 (six) hours as needed for mild pain (pain score 1-3) or fever (or Fever >/= 101).   bisacodyl 10 MG suppository Commonly known as: Dulcolax Place 1 suppository (10 mg total) rectally as needed for moderate constipation.   cephALEXin 500 MG capsule Commonly known as: KEFLEX Take 1 capsule (500 mg total) by mouth 3 (three) times daily for 3 days. Start taking on: April 18, 2024   citalopram  20 MG tablet Commonly known as: CeleXA  Take 1 tablet (20 mg total) by mouth daily. What changed:  medication strength how much to take  cyanocobalamin 1000 MCG tablet Take 1,000 mcg by mouth daily.   donepezil  10 MG tablet Commonly known as: ARICEPT  Take 1 tablet (10 mg total) by mouth at bedtime.   memantine  10 MG tablet Commonly known as: NAMENDA  Take 1 tablet (10 mg total) by mouth 2 (two) times daily.   QUEtiapine  50 MG Tb24 24 hr tablet Commonly known as: SEROQUEL  XR Take 2 tablets (100 mg total) by mouth at bedtime. Replaces: QUEtiapine  50 MG tablet   QUEtiapine  50 MG tablet Commonly known as: SEROQUEL  Take 1 tablet (50 mg total) by mouth See admin instructions. Take 100 mg at bedtime and 50 mg Qam---   senna-docusate 8.6-50 MG tablet Commonly known as: Senokot-S Take 2 tablets by mouth at bedtime.               Durable Medical Equipment  (From admission, onward)           Start     Ordered   04/17/24 1029  For home use only DME standard manual wheelchair with seat cushion  Once       Comments: Patient suffers from ambulatory dysfunction and recurrent falls and chronic diastolic dysfunction CHF which impairs their ability to perform daily activities like dressing, feeding, and grooming in the home.  A cane, crutch, or walker will not resolve issue with performing activities of daily living. A wheelchair will allow patient to safely perform daily  activities. Patient can safely propel the wheelchair in the home or has a caregiver who can provide assistance. Length of need Lifetime. Accessories: elevating leg rests (ELRs), wheel locks, extensions and anti-tippers.  -ambulatory dysfunction and recurrent falls and chronic diastolic dysfunction CHF   04/17/24 1029   04/16/24 1318  For home use only DME 4 wheeled rolling walker with seat  Once       Comments: Dx-Diastolic dysfunction congestive heart failure and generalized weakness and ambulatory dysfunction/recurrent falls  Question:  Patient needs a walker to treat with the following condition  Answer:  Congestive heart failure with left ventricular diastolic dysfunction, unspecified failure chronicity (HCC)   04/16/24 1318   04/16/24 1317  For home use only DME 3 n 1  Once       Comments: Dx-Diastolic dysfunction congestive heart failure and generalized weakness and ambulatory dysfunction/recurrent falls   04/16/24 1318   04/16/24 1315  For home use only DME Hospital bed  Once       Comments: Dx--Diastolic dysfunction congestive heart failure  Question Answer Comment  Length of Need Lifetime   Patient has (list medical condition): Dx-Diastolic dysfunction congestive heart failure and generalized weakness and ambulatory dysfunction/recurrent falls   The above medical condition requires: Patient requires the ability to reposition frequently   Head must be elevated greater than: 45 degrees   Bed type Semi-electric   Support Surface: Gel Overlay      04/16/24 1318           Major procedures and Radiology Reports - PLEASE review detailed and final reports for all details, in brief -   CT HEAD WO CONTRAST ( ) Result Date: 04/14/2024 CLINICAL DATA:  Clemens and hit head last week, with increasing confusion since then. Previous history of Alzheimer's dementia. Caretakers report darkening urine. Question UTI. The patient has been guarding h the at the is abdomen which the caretakers  interpreted is abdominal pain. EXAM: CT HEAD WITHOUT CONTRAST CT ABDOMEN AND PELVIS WITHOUT CONTRAST TECHNIQUE: Contiguous axial images were obtained from the base of the skull  through the vertex without intravenous contrast. Multiplanar CT image reconstructions were also generated. Multidetector CT imaging of the abdomen and pelvis was performed following the standard protocol without IV contrast. RADIATION DOSE REDUCTION: This exam was performed according to the departmental dose-optimization program which includes automated exposure control, adjustment of the mA and/or kV according to patient size and/or use of iterative reconstruction technique. COMPARISON:  Head CT recently 04/11/2024, head CT report with images unavailable from 11/29/2022. No prior abdominal films or imaging. FINDINGS: CT HEAD FINDINGS Brain: There is mild-to-moderate cerebral atrophy with atrophic ventriculomegaly and mild small vessel disease of the cerebral white matter. The cerebellum and brainstem are unremarkable. No cortical based acute infarct, hemorrhage, mass or mass effect are seen. There is no midline shift.  The basal cisterns are clear. Vascular: There are calcific plaques in both siphons. No hyperdense central vasculature is seen. Skull: Negative for depressed fractures or focal lesions. No visible scalp hematoma. Sinuses/Orbits: Bilateral symmetric proptosis with otherwise negative orbits. There is patchy membrane thickening in the ethmoids. Other paranasal sinuses are clear. Mild fluid in the right mastoid tip is again noted with the mastoid air cells otherwise clear. Nasal septum deviates left. There are bilateral wax impactions in the external auditory canals. Other: None. CT ABDOMEN AND PELVIS FINDINGS Technical note: Patient was unwilling to lie flat for the study. Positioning for this exam is right lateral decubitus. Lower chest: There is marked elevation of the right hemidiaphragm consistent with eventration or paresis,  with overlying atelectasis. Lung bases are otherwise clear. There is mild bronchial thickening in the lower lobes. The cardiac size is normal. Coronary arteries are heavily calcified. Hepatobiliary: No focal liver abnormality is seen without contrast. No gallstones, gallbladder wall thickening, or biliary dilatation. Pancreas: No abnormality is seen without contrast. Spleen: No abnormality is seen without contrast. Adrenals/Urinary Tract: There is no adrenal mass, no contour deforming abnormality of the unenhanced kidneys. Symmetric perinephric stranding change, typically chronic but without old studies chronicity is indeterminate and possible this could be inflammatory or from pyelonephritis. There is a 1 mm nonobstructive caliceal stone in the superior pole right kidney, 2 mm caliceal stone in the superior pole of the left. No other nephrolithiasis is seen. There is no hydronephrosis or ureteral stone. The bladder is thickened with perivesical stranding concerning for cystitis. Some of the thickening could in part be due to chronic bladder outlet obstruction as the prostate is significantly enlarged measuring 6.1 cm. Stomach/Bowel: Unremarkable contracted stomach and unopacified small bowel. An appendix is not seen. There is mild fecal stasis. There is colonic interposition of the hepatic flexure between the liver and abdominal wall. Uncomplicated sigmoid diverticulosis. No evidence of acute colitis or diverticulitis. Vascular/Lymphatic: Aortic atherosclerosis. No enlarged abdominal or pelvic lymph nodes. Reproductive: Enlarged prostate, dystrophic calcifications centrally. As above measures 6.1 cm transverse axis. Both testicles are in the scrotal sac. Other: There is a small left inguinal fat hernia. No incarcerated hernia. Trace presacral ascites. No free hemorrhage, free air or abscess. Musculoskeletal: Mild levoscoliosis and degenerative changes of the spine. Osteopenia. Acquired spinal stenosis L4-5.  IMPRESSION: 1. No acute intracranial CT findings or depressed skull fractures. 2. Atrophy and small-vessel disease. 3. Bilateral proptosis. 4. Mild ethmoid sinus membrane thickening. 5. Bilateral wax impactions in the external auditory canals. 6. Marked elevation of the right hemidiaphragm consistent with eventration or paresis, with overlying atelectasis. 7. Aortic and coronary artery atherosclerosis. 8. Constipation and diverticulosis. 9. Enlarged prostate with bladder thickening and perivesical stranding concerning for  cystitis. There could also be a chronic bladder outlet problem. 10. Nonobstructive nephrolithiasis. 11. Trace presacral ascites. 12. Osteopenia and degenerative change. Acquired spinal stenosis L4-5. Aortic Atherosclerosis (ICD10-I70.0). Electronically Signed   By: Francis Quam M.D.   On: 04/14/2024 01:11   CT ABDOMEN PELVIS WO CONTRAST Result Date: 04/14/2024 CLINICAL DATA:  Clemens and hit head last week, with increasing confusion since then. Previous history of Alzheimer's dementia. Caretakers report darkening urine. Question UTI. The patient has been guarding h the at the is abdomen which the caretakers interpreted is abdominal pain. EXAM: CT HEAD WITHOUT CONTRAST CT ABDOMEN AND PELVIS WITHOUT CONTRAST TECHNIQUE: Contiguous axial images were obtained from the base of the skull through the vertex without intravenous contrast. Multiplanar CT image reconstructions were also generated. Multidetector CT imaging of the abdomen and pelvis was performed following the standard protocol without IV contrast. RADIATION DOSE REDUCTION: This exam was performed according to the departmental dose-optimization program which includes automated exposure control, adjustment of the mA and/or kV according to patient size and/or use of iterative reconstruction technique. COMPARISON:  Head CT recently 04/11/2024, head CT report with images unavailable from 11/29/2022. No prior abdominal films or imaging. FINDINGS:  CT HEAD FINDINGS Brain: There is mild-to-moderate cerebral atrophy with atrophic ventriculomegaly and mild small vessel disease of the cerebral white matter. The cerebellum and brainstem are unremarkable. No cortical based acute infarct, hemorrhage, mass or mass effect are seen. There is no midline shift.  The basal cisterns are clear. Vascular: There are calcific plaques in both siphons. No hyperdense central vasculature is seen. Skull: Negative for depressed fractures or focal lesions. No visible scalp hematoma. Sinuses/Orbits: Bilateral symmetric proptosis with otherwise negative orbits. There is patchy membrane thickening in the ethmoids. Other paranasal sinuses are clear. Mild fluid in the right mastoid tip is again noted with the mastoid air cells otherwise clear. Nasal septum deviates left. There are bilateral wax impactions in the external auditory canals. Other: None. CT ABDOMEN AND PELVIS FINDINGS Technical note: Patient was unwilling to lie flat for the study. Positioning for this exam is right lateral decubitus. Lower chest: There is marked elevation of the right hemidiaphragm consistent with eventration or paresis, with overlying atelectasis. Lung bases are otherwise clear. There is mild bronchial thickening in the lower lobes. The cardiac size is normal. Coronary arteries are heavily calcified. Hepatobiliary: No focal liver abnormality is seen without contrast. No gallstones, gallbladder wall thickening, or biliary dilatation. Pancreas: No abnormality is seen without contrast. Spleen: No abnormality is seen without contrast. Adrenals/Urinary Tract: There is no adrenal mass, no contour deforming abnormality of the unenhanced kidneys. Symmetric perinephric stranding change, typically chronic but without old studies chronicity is indeterminate and possible this could be inflammatory or from pyelonephritis. There is a 1 mm nonobstructive caliceal stone in the superior pole right kidney, 2 mm caliceal stone  in the superior pole of the left. No other nephrolithiasis is seen. There is no hydronephrosis or ureteral stone. The bladder is thickened with perivesical stranding concerning for cystitis. Some of the thickening could in part be due to chronic bladder outlet obstruction as the prostate is significantly enlarged measuring 6.1 cm. Stomach/Bowel: Unremarkable contracted stomach and unopacified small bowel. An appendix is not seen. There is mild fecal stasis. There is colonic interposition of the hepatic flexure between the liver and abdominal wall. Uncomplicated sigmoid diverticulosis. No evidence of acute colitis or diverticulitis. Vascular/Lymphatic: Aortic atherosclerosis. No enlarged abdominal or pelvic lymph nodes. Reproductive: Enlarged prostate, dystrophic calcifications centrally. As  above measures 6.1 cm transverse axis. Both testicles are in the scrotal sac. Other: There is a small left inguinal fat hernia. No incarcerated hernia. Trace presacral ascites. No free hemorrhage, free air or abscess. Musculoskeletal: Mild levoscoliosis and degenerative changes of the spine. Osteopenia. Acquired spinal stenosis L4-5. IMPRESSION: 1. No acute intracranial CT findings or depressed skull fractures. 2. Atrophy and small-vessel disease. 3. Bilateral proptosis. 4. Mild ethmoid sinus membrane thickening. 5. Bilateral wax impactions in the external auditory canals. 6. Marked elevation of the right hemidiaphragm consistent with eventration or paresis, with overlying atelectasis. 7. Aortic and coronary artery atherosclerosis. 8. Constipation and diverticulosis. 9. Enlarged prostate with bladder thickening and perivesical stranding concerning for cystitis. There could also be a chronic bladder outlet problem. 10. Nonobstructive nephrolithiasis. 11. Trace presacral ascites. 12. Osteopenia and degenerative change. Acquired spinal stenosis L4-5. Aortic Atherosclerosis (ICD10-I70.0). Electronically Signed   By: Francis Quam  M.D.   On: 04/14/2024 01:11   DG Chest Port 1 View Result Date: 04/13/2024 CLINICAL DATA:  Altered mental status. EXAM: PORTABLE CHEST 1 VIEW COMPARISON:  Portable chest 04/11/2024. FINDINGS: The cardiac size is normal. No vascular congestion is seen. Mediastinum is normally outlined. There is calcification in the transverse aorta. Low lung volumes. Limited view of lung bases especially on the right where the diaphragm is again noted elevated. The visualized lungs are clear of infiltrates. Right perihilar linear atelectasis is again shown. No new or acute osseous findings. IMPRESSION: 1. Low lung volumes with right perihilar linear atelectasis. No evidence of acute chest disease as far as seen. 2. Aortic atherosclerosis. Electronically Signed   By: Francis Quam M.D.   On: 04/13/2024 23:32   DG Chest Portable 1 View Result Date: 04/11/2024 CLINICAL DATA:  Recurrent falls EXAM: PORTABLE CHEST 1 VIEW COMPARISON:  09/11/2021 FINDINGS: Heart and mediastinal contours are within normal limits. Low lung volumes. Elevation of the right hemidiaphragm with right base atelectasis. No confluent opacity on the left. No effusions or pneumothorax. No acute bony abnormality. IMPRESSION: Low lung volumes. Stable chronic elevation of the right hemidiaphragm with right base atelectasis. Electronically Signed   By: Franky Crease M.D.   On: 04/11/2024 22:35   CT Head Wo Contrast Result Date: 04/11/2024 CLINICAL DATA:  Recent fall with headaches and neck pain, initial encounter EXAM: CT HEAD WITHOUT CONTRAST CT CERVICAL SPINE WITHOUT CONTRAST TECHNIQUE: Multidetector CT imaging of the head and cervical spine was performed following the standard protocol without intravenous contrast. Multiplanar CT image reconstructions of the cervical spine were also generated. RADIATION DOSE REDUCTION: This exam was performed according to the departmental dose-optimization program which includes automated exposure control, adjustment of the mA  and/or kV according to patient size and/or use of iterative reconstruction technique. COMPARISON:  None Available. FINDINGS: CT HEAD FINDINGS Brain: No evidence of acute infarction, hemorrhage, hydrocephalus, extra-axial collection or mass lesion/mass effect. Vascular: No hyperdense vessel or unexpected calcification. Skull: Normal. Negative for fracture or focal lesion. Sinuses/Orbits: No acute finding. Other: Small scalp hematoma is noted in the right frontal region. CT CERVICAL SPINE FINDINGS Alignment: Mild straightening of the normal cervical lordosis is noted. Skull base and vertebrae: 7 cervical segments are well visualized. Mild osteophytic changes and facet hypertrophic changes are noted. No acute fracture or acute facet abnormality is noted. The odontoid is within normal limits. Soft tissues and spinal canal: Surrounding soft tissue structures are within normal limits. Upper chest: Visualized lung apices are unremarkable. Other: None IMPRESSION: CT of the head: Mild scalp hematoma  on the right. No acute intracranial abnormality noted. CT of the cervical spine: Mild degenerative changes without acute abnormality. Electronically Signed   By: Oneil Devonshire M.D.   On: 04/11/2024 22:01   CT Cervical Spine Wo Contrast Result Date: 04/11/2024 CLINICAL DATA:  Recent fall with headaches and neck pain, initial encounter EXAM: CT HEAD WITHOUT CONTRAST CT CERVICAL SPINE WITHOUT CONTRAST TECHNIQUE: Multidetector CT imaging of the head and cervical spine was performed following the standard protocol without intravenous contrast. Multiplanar CT image reconstructions of the cervical spine were also generated. RADIATION DOSE REDUCTION: This exam was performed according to the departmental dose-optimization program which includes automated exposure control, adjustment of the mA and/or kV according to patient size and/or use of iterative reconstruction technique. COMPARISON:  None Available. FINDINGS: CT HEAD FINDINGS  Brain: No evidence of acute infarction, hemorrhage, hydrocephalus, extra-axial collection or mass lesion/mass effect. Vascular: No hyperdense vessel or unexpected calcification. Skull: Normal. Negative for fracture or focal lesion. Sinuses/Orbits: No acute finding. Other: Small scalp hematoma is noted in the right frontal region. CT CERVICAL SPINE FINDINGS Alignment: Mild straightening of the normal cervical lordosis is noted. Skull base and vertebrae: 7 cervical segments are well visualized. Mild osteophytic changes and facet hypertrophic changes are noted. No acute fracture or acute facet abnormality is noted. The odontoid is within normal limits. Soft tissues and spinal canal: Surrounding soft tissue structures are within normal limits. Upper chest: Visualized lung apices are unremarkable. Other: None IMPRESSION: CT of the head: Mild scalp hematoma on the right. No acute intracranial abnormality noted. CT of the cervical spine: Mild degenerative changes without acute abnormality. Electronically Signed   By: Oneil Devonshire M.D.   On: 04/11/2024 22:01   Micro Results   Recent Results (from the past 240 hours)  Urine Culture     Status: Abnormal   Collection Time: 04/14/24  1:30 AM   Specimen: Urine, Clean Catch  Result Value Ref Range Status   Specimen Description   Final    URINE, CLEAN CATCH Performed at [facility], [address], [place], [place] [address]    Special Requests   Final    NONE Performed at [facility], [address] [place], [place] [address]    Culture >=100,000 COLONIES/mL ESCHERICHIA COLI (A)  Final   Report Status 04/17/2024 FINAL  Final   Organism ID, Bacteria ESCHERICHIA COLI (A)  Final      Susceptibility   Escherichia coli - MIC*    AMPICILLIN >=32 RESISTANT Resistant     CEFAZOLIN <=4 SENSITIVE Sensitive     CEFEPIME <=0.12 SENSITIVE Sensitive     CEFTRIAXONE  <=0.25 SENSITIVE Sensitive     CIPROFLOXACIN <=0.25 SENSITIVE Sensitive     GENTAMICIN <=1 SENSITIVE  Sensitive     IMIPENEM <=0.25 SENSITIVE Sensitive     NITROFURANTOIN <=16 SENSITIVE Sensitive     TRIMETH/SULFA <=20 SENSITIVE Sensitive     AMPICILLIN/SULBACTAM 16 INTERMEDIATE Intermediate     PIP/TAZO <=4 SENSITIVE Sensitive ug/mL    * >=100,000 COLONIES/mL ESCHERICHIA COLI   Today   Subjective    Yovani Cogburn today has no new complaints - Wife at bedside, questions answered - Physical therapy attempted to work with patient.  Patient was again not very cooperative due to dementia   Patient has been seen and examined prior to discharge   Objective   Blood pressure 128/70, pulse 77, temperature 97.7 F (36.5 C), temperature source Oral, resp. rate 16, height 5' 10 (1.778 m), weight 87.3 kg,  SpO2 97%.   Intake/Output Summary (Last 24 hours) at 04/17/2024 1039 Last data filed at 04/17/2024 0850 Gross per 24 hour  Intake 240 ml  Output 850 ml  Net -610 ml    Exam Gen:- Awake Alert, pleasantly confused and restless at times HEENT:- .AT, No sclera icterus Neck-Supple Neck,No JVD,.  Lungs-  CTAB , fair symmetrical air movement CV- S1, S2 normal, regular  Abd-  +ve B.Sounds, Abd Soft, No tenderness, no CVA tenderness    Extremity/Skin:- No  edema, pedal pulses present  Psych-significant cognitive and memory deficits consistent with underlying dementia neuro-generalized weakness, no new focal deficits, no tremors   Data Review   CBC w Diff:  Lab Results  Component Value Date   WBC 9.3 04/15/2024   HGB 13.4 04/15/2024   HCT 39.1 04/15/2024   PLT 132 (L) 04/15/2024   LYMPHOPCT 39 04/11/2024   MONOPCT 10 04/11/2024   EOSPCT 4 04/11/2024   BASOPCT 1 04/11/2024   CMP:  Lab Results  Component Value Date   NA 135 04/15/2024   K 3.4 (L) 04/15/2024   CL 105 04/15/2024   CO2 21 (L) 04/15/2024   BUN 16 04/15/2024   CREATININE 0.78 04/15/2024   PROT 6.5 04/13/2024   ALBUMIN 3.4 (L) 04/13/2024   BILITOT 1.6 (H) 04/13/2024   ALKPHOS 66 04/13/2024   AST 22  04/13/2024   ALT 22 04/13/2024  . Total Discharge time is about 33 minutes  Rendall Carwin M.D on 04/17/2024 at 10:39 AM  Go to [email or web address] -  for contact info  [facility] - Office  [phone]

## 2024-04-17 NOTE — Progress Notes (Signed)
 At beginning of shift was restless and speech incomprehensible .  Since around 0110 has been sleeping. Sitter at bedside.

## 2024-04-17 NOTE — Progress Notes (Signed)
 Patient becoming agitated and attempting to get OOB, redirection unhelpful , given IM Haldol  and prn tylenol . Also gave patient ensure to drink and some ice cream , this seemed to help as well. Safety sitter in place , patient awaiting transportation home via EMS transport.

## 2024-04-18 ENCOUNTER — Other Ambulatory Visit (HOSPITAL_COMMUNITY): Payer: Self-pay

## 2024-05-03 DIAGNOSIS — N390 Urinary tract infection, site not specified: Secondary | ICD-10-CM | POA: Diagnosis not present

## 2024-05-06 ENCOUNTER — Inpatient Hospital Stay (HOSPITAL_COMMUNITY)
Admission: EM | Admit: 2024-05-06 | Discharge: 2024-05-10 | DRG: 871 | Disposition: A | Discharge status: Home / Self Care | Attending: Family Medicine | Admitting: Family Medicine

## 2024-05-06 ENCOUNTER — Other Ambulatory Visit: Payer: Self-pay

## 2024-05-06 ENCOUNTER — Emergency Department (HOSPITAL_COMMUNITY)

## 2024-05-06 ENCOUNTER — Encounter (HOSPITAL_COMMUNITY): Payer: Self-pay | Admitting: Internal Medicine

## 2024-05-06 DIAGNOSIS — Z888 Allergy status to other drugs, medicaments and biological substances status: Secondary | ICD-10-CM

## 2024-05-06 DIAGNOSIS — F05 Delirium due to known physiological condition: Secondary | ICD-10-CM | POA: Diagnosis present

## 2024-05-06 DIAGNOSIS — Z8249 Family history of ischemic heart disease and other diseases of the circulatory system: Secondary | ICD-10-CM

## 2024-05-06 DIAGNOSIS — Z87891 Personal history of nicotine dependence: Secondary | ICD-10-CM

## 2024-05-06 DIAGNOSIS — F02818 Dementia in other diseases classified elsewhere, unspecified severity, with other behavioral disturbance: Secondary | ICD-10-CM | POA: Diagnosis present

## 2024-05-06 DIAGNOSIS — Z806 Family history of leukemia: Secondary | ICD-10-CM

## 2024-05-06 DIAGNOSIS — Z880 Allergy status to penicillin: Secondary | ICD-10-CM

## 2024-05-06 DIAGNOSIS — F0284 Dementia in other diseases classified elsewhere, unspecified severity, with anxiety: Secondary | ICD-10-CM | POA: Diagnosis present

## 2024-05-06 DIAGNOSIS — F0390 Unspecified dementia without behavioral disturbance: Secondary | ICD-10-CM | POA: Diagnosis present

## 2024-05-06 DIAGNOSIS — R296 Repeated falls: Secondary | ICD-10-CM | POA: Diagnosis present

## 2024-05-06 DIAGNOSIS — Z9181 History of falling: Secondary | ICD-10-CM

## 2024-05-06 DIAGNOSIS — A419 Sepsis, unspecified organism: Secondary | ICD-10-CM | POA: Diagnosis present

## 2024-05-06 DIAGNOSIS — G309 Alzheimer's disease, unspecified: Secondary | ICD-10-CM | POA: Diagnosis present

## 2024-05-06 DIAGNOSIS — G9341 Metabolic encephalopathy: Secondary | ICD-10-CM | POA: Diagnosis present

## 2024-05-06 DIAGNOSIS — A4151 Sepsis due to Escherichia coli [E. coli]: Principal | ICD-10-CM | POA: Diagnosis present

## 2024-05-06 DIAGNOSIS — E785 Hyperlipidemia, unspecified: Secondary | ICD-10-CM | POA: Diagnosis present

## 2024-05-06 DIAGNOSIS — Z66 Do not resuscitate: Secondary | ICD-10-CM | POA: Diagnosis present

## 2024-05-06 DIAGNOSIS — Z79899 Other long term (current) drug therapy: Secondary | ICD-10-CM

## 2024-05-06 DIAGNOSIS — R531 Weakness: Secondary | ICD-10-CM | POA: Diagnosis not present

## 2024-05-06 DIAGNOSIS — Z1152 Encounter for screening for COVID-19: Secondary | ICD-10-CM

## 2024-05-06 DIAGNOSIS — N390 Urinary tract infection, site not specified: Principal | ICD-10-CM | POA: Diagnosis present

## 2024-05-06 DIAGNOSIS — I5032 Chronic diastolic (congestive) heart failure: Secondary | ICD-10-CM | POA: Diagnosis present

## 2024-05-06 DIAGNOSIS — Z743 Need for continuous supervision: Secondary | ICD-10-CM | POA: Diagnosis not present

## 2024-05-06 LAB — CBC WITH DIFFERENTIAL/PLATELET
Abs Immature Granulocytes: 0.02 K/uL (ref 0.00–0.07)
Basophils Absolute: 0.1 K/uL (ref 0.0–0.1)
Basophils Relative: 1 %
Eosinophils Absolute: 0.3 K/uL (ref 0.0–0.5)
Eosinophils Relative: 5 %
HCT: 43.4 % (ref 39.0–52.0)
Hemoglobin: 14.7 g/dL (ref 13.0–17.0)
Immature Granulocytes: 0 %
Lymphocytes Relative: 30 %
Lymphs Abs: 2 K/uL (ref 0.7–4.0)
MCH: 33.9 pg (ref 26.0–34.0)
MCHC: 33.9 g/dL (ref 30.0–36.0)
MCV: 100 fL (ref 80.0–100.0)
Monocytes Absolute: 0.7 K/uL (ref 0.1–1.0)
Monocytes Relative: 10 %
Neutro Abs: 3.7 K/uL (ref 1.7–7.7)
Neutrophils Relative %: 54 %
Platelets: 364 K/uL (ref 150–400)
RBC: 4.34 MIL/uL (ref 4.22–5.81)
RDW: 11.8 % (ref 11.5–15.5)
WBC: 6.8 K/uL (ref 4.0–10.5)
nRBC: 0 % (ref 0.0–0.2)

## 2024-05-06 LAB — URINALYSIS, ROUTINE W REFLEX MICROSCOPIC
Bilirubin Urine: NEGATIVE
Glucose, UA: NEGATIVE mg/dL
Ketones, ur: NEGATIVE mg/dL
Nitrite: POSITIVE — AB
Protein, ur: 30 mg/dL — AB
Specific Gravity, Urine: 1.019 (ref 1.005–1.030)
pH: 5 (ref 5.0–8.0)

## 2024-05-06 LAB — COMPREHENSIVE METABOLIC PANEL WITH GFR
ALT: 25 U/L (ref 0–44)
AST: 22 U/L (ref 15–41)
Albumin: 3 g/dL — ABNORMAL LOW (ref 3.5–5.0)
Alkaline Phosphatase: 85 U/L (ref 38–126)
Anion gap: 13 (ref 5–15)
BUN: 20 mg/dL (ref 8–23)
CO2: 22 mmol/L (ref 22–32)
Calcium: 8.8 mg/dL — ABNORMAL LOW (ref 8.9–10.3)
Chloride: 103 mmol/L (ref 98–111)
Creatinine, Ser: 0.88 mg/dL (ref 0.61–1.24)
GFR, Estimated: >60 mL/min (ref >60)
Glucose, Bld: 141 mg/dL — ABNORMAL HIGH (ref 70–99)
Potassium: 3.9 mmol/L (ref 3.5–5.1)
Sodium: 138 mmol/L (ref 135–145)
Total Bilirubin: 0.7 mg/dL (ref 0.0–1.2)
Total Protein: 7.5 g/dL (ref 6.5–8.1)

## 2024-05-06 LAB — TSH: TSH: 0.517 u[IU]/mL (ref 0.350–4.500)

## 2024-05-06 LAB — RESP PANEL BY RT-PCR (RSV, FLU A&B, COVID)  RVPGX2
Influenza A by PCR: NEGATIVE
Influenza B by PCR: NEGATIVE
Resp Syncytial Virus by PCR: NEGATIVE
SARS Coronavirus 2 by RT PCR: NEGATIVE

## 2024-05-06 LAB — AMMONIA: Ammonia: 19 umol/L (ref 9–35)

## 2024-05-06 LAB — TROPONIN I (HIGH SENSITIVITY): Troponin I (High Sensitivity): 4 ng/L (ref <18)

## 2024-05-06 MED ORDER — DONEPEZIL HCL 5 MG PO TABS
10.0000 mg | ORAL_TABLET | Freq: Every day | ORAL | Status: DC
  Administered 2024-05-07 – 2024-05-09 (×3): 10 mg via ORAL
  Filled 2024-05-06 (×4): qty 2

## 2024-05-06 MED ORDER — ONDANSETRON HCL 4 MG PO TABS: 4.0000 mg | ORAL_TABLET | Freq: Four times a day (QID) | ORAL | Status: DC | PRN

## 2024-05-06 MED ORDER — BISACODYL 10 MG RE SUPP: 10.0000 mg | RECTAL | Status: DC | PRN

## 2024-05-06 MED ORDER — QUETIAPINE FUMARATE ER 50 MG PO TB24: 100.0000 mg | ORAL_TABLET | Freq: Every day | ORAL | Status: DC

## 2024-05-06 MED ORDER — LORAZEPAM 2 MG/ML IJ SOLN
1.0000 mg | INTRAMUSCULAR | Status: DC | PRN
  Administered 2024-05-07 – 2024-05-10 (×7): 1 mg via INTRAVENOUS
  Filled 2024-05-06 (×7): qty 1

## 2024-05-06 MED ORDER — CITALOPRAM HYDROBROMIDE 20 MG PO TABS
20.0000 mg | ORAL_TABLET | Freq: Every day | ORAL | Status: DC
  Administered 2024-05-07 – 2024-05-10 (×4): 20 mg via ORAL
  Filled 2024-05-06 (×4): qty 1

## 2024-05-06 MED ORDER — QUETIAPINE FUMARATE 100 MG PO TABS
150.0000 mg | ORAL_TABLET | Freq: Every day | ORAL | Status: DC
  Administered 2024-05-07 – 2024-05-09 (×3): 150 mg via ORAL
  Filled 2024-05-06 (×4): qty 2

## 2024-05-06 MED ORDER — QUETIAPINE FUMARATE 25 MG PO TABS
50.0000 mg | ORAL_TABLET | Freq: Every morning | ORAL | Status: DC
  Administered 2024-05-07 – 2024-05-10 (×4): 50 mg via ORAL
  Filled 2024-05-06 (×4): qty 2

## 2024-05-06 MED ORDER — QUETIAPINE FUMARATE 25 MG PO TABS: 50.0000 mg | ORAL_TABLET | ORAL | Status: DC

## 2024-05-06 MED ORDER — VITAMIN B-12 1000 MCG PO TABS
1000.0000 ug | ORAL_TABLET | Freq: Every day | ORAL | Status: DC
  Administered 2024-05-07 – 2024-05-10 (×4): 1000 ug via ORAL
  Filled 2024-05-06 (×4): qty 1

## 2024-05-06 MED ORDER — MEMANTINE HCL 10 MG PO TABS
10.0000 mg | ORAL_TABLET | Freq: Two times a day (BID) | ORAL | Status: DC
  Administered 2024-05-07 – 2024-05-10 (×7): 10 mg via ORAL
  Filled 2024-05-06 (×8): qty 1

## 2024-05-06 MED ORDER — LORAZEPAM 2 MG/ML IJ SOLN
1.0000 mg | Freq: Once | INTRAMUSCULAR | Status: AC
  Administered 2024-05-06: 1 mg via INTRAVENOUS
  Filled 2024-05-06: qty 1

## 2024-05-06 MED ORDER — ACETAMINOPHEN 650 MG RE SUPP: 650.0000 mg | Freq: Four times a day (QID) | RECTAL | Status: DC | PRN

## 2024-05-06 MED ORDER — SODIUM CHLORIDE 0.9 % IV SOLN
1.0000 g | Freq: Once | INTRAVENOUS | Status: AC
  Administered 2024-05-06: 1 g via INTRAVENOUS
  Filled 2024-05-06: qty 10

## 2024-05-06 MED ORDER — SODIUM CHLORIDE 0.9 % IV BOLUS
500.0000 mL | Freq: Once | INTRAVENOUS | Status: AC
  Administered 2024-05-06: 1000 mL via INTRAVENOUS

## 2024-05-06 MED ORDER — HALOPERIDOL LACTATE 5 MG/ML IJ SOLN: 2.0000 mg | Freq: Four times a day (QID) | INTRAMUSCULAR | Status: DC | PRN

## 2024-05-06 MED ORDER — ACETAMINOPHEN 325 MG PO TABS
650.0000 mg | ORAL_TABLET | Freq: Four times a day (QID) | ORAL | Status: DC | PRN
Start: 2024-05-06 — End: 2024-05-10
  Administered 2024-05-09: 650 mg via ORAL
  Filled 2024-05-06: qty 2

## 2024-05-06 MED ORDER — SODIUM CHLORIDE 0.9 % IV SOLN: 1.0000 g | INTRAVENOUS | Status: DC

## 2024-05-06 MED ORDER — SENNOSIDES-DOCUSATE SODIUM 8.6-50 MG PO TABS: 2.0000 | ORAL_TABLET | Freq: Every evening | ORAL | Status: DC | PRN

## 2024-05-06 MED ORDER — ONDANSETRON HCL 4 MG/2ML IJ SOLN: 4.0000 mg | Freq: Four times a day (QID) | INTRAMUSCULAR | Status: DC | PRN

## 2024-05-06 MED ORDER — SENNOSIDES-DOCUSATE SODIUM 8.6-50 MG PO TABS: 2.0000 | ORAL_TABLET | Freq: Every day | ORAL | Status: DC

## 2024-05-06 MED ORDER — ENOXAPARIN SODIUM 40 MG/0.4ML IJ SOSY
40.0000 mg | PREFILLED_SYRINGE | INTRAMUSCULAR | Status: DC
  Administered 2024-05-07 – 2024-05-09 (×3): 40 mg via SUBCUTANEOUS
  Filled 2024-05-06 (×4): qty 0.4

## 2024-05-06 NOTE — H&P (Addendum)
 History and Physical    Davidlee Jeanbaptiste FMW:[id number] DOB: 12/26/1945 DOA: 05/06/2024  PCP: [facility]   Patient coming from: Home  Chief Complaint: Confusion/behavioral disturbance  HPI: Christopher Mccoy is a 78 y.o. male with medical history significant for Alzheimer's dementia with behavioral disturbances, dyslipidemia, chronic diastolic CHF, and recent discharge on 6/29 after admission for acute metabolic encephalopathy with E. coli UTI.  He was brought to the ED due to altered mentation and combativeness over the past few days according to his wife at bedside.  He had his urine rechecked by home health nurse and was noted to have UTI and apparently urine culture had come back positive for E. coli, but he was brought to the ED prior to initiating treatment at home.  He has not been eating or drinking reliably at home and has not been taking his home medications reliably as well.  Placement as well as consultation with palliative was discussed on recent admission, and wife was reminded of this and refuses both placement or palliative consultation at this time.   ED Course: Vital signs stable and patient afebrile.  Lab work without any significant abnormalities.  CT head with chronic findings and no acute abnormalities.  Urinalysis positive for UTI and patient started on Rocephin  in the ED.  He was also given some Ativan  for anxiety/agitation and is currently somnolent.  Review of Systems: Could not be obtained given patient condition.  Past Medical History:  Diagnosis Date   Alzheimer's dementia (HCC)    Left arm pain     Past Surgical History:  Procedure Laterality Date   KNEE SURGERY     NM PET IMG [facility]     SHOULDER SURGERY       reports that he has been smoking cigarettes. He does not have any smokeless tobacco history on file. He reports that he does not currently use alcohol . He reports that he does not currently use drugs.  Allergies  Allergen Reactions    Penicillins Anaphylaxis    Family History  Problem Relation Age of Onset   Heart failure Mother    Leukemia Father     Prior to Admission medications   Medication Sig Start Date End Date Taking? Authorizing Provider  acetaminophen  (TYLENOL ) 325 MG tablet Take 2 tablets (650 mg total) by mouth every 6 (six) hours as needed for mild pain (pain score 1-3) or fever (or Fever >/= 101). 04/17/24   Emokpae, Courage, MD  bisacodyl  (DULCOLAX) 10 MG suppository Place 1 suppository (10 mg total) rectally as needed for moderate constipation. 04/17/24   Pearlean Manus, MD  citalopram  (CELEXA ) 20 MG tablet Take 1 tablet (20 mg total) by mouth daily. 04/17/24 04/17/25  Pearlean Manus, MD  cyanocobalamin  1000 MCG tablet Take 1,000 mcg by mouth daily.    [provider]  donepezil  (ARICEPT ) 10 MG tablet Take 1 tablet (10 mg total) by mouth at bedtime. 04/17/24   Pearlean Manus, MD  memantine  (NAMENDA ) 10 MG tablet Take 1 tablet (10 mg total) by mouth 2 (two) times daily. 04/17/24   Pearlean Manus, MD  QUEtiapine  (SEROQUEL  XR) 50 MG TB24 24 hr tablet Take 2 tablets (100 mg total) by mouth at bedtime. 04/17/24   Pearlean Manus, MD  QUEtiapine  (SEROQUEL ) 50 MG tablet Take 1 tablet (50 mg total) by mouth See admin instructions. Take 100 mg at bedtime and 50 mg Qam--- 04/17/24   Pearlean Manus, MD  senna-docusate (SENOKOT-S) 8.6-50 MG tablet Take 2 tablets by mouth  at bedtime. 04/17/24   Pearlean Manus, MD    Physical Exam: Vitals:   05/06/24 1445 05/06/24 1451 05/06/24 1530 05/06/24 1600  BP: (!) 122/102  92/61 103/70  Pulse:   70 68  Resp:  (!) 21 15 14   Temp:  97.9 F (36.6 C)    TempSrc:  Axillary    SpO2:   91% 92%  Weight:      Height:        Constitutional: NAD, currently somnolent and unresponsive to questioning Vitals:   05/06/24 1445 05/06/24 1451 05/06/24 1530 05/06/24 1600  BP: (!) 122/102  92/61 103/70  Pulse:   70 68  Resp:  (!) 21 15 14   Temp:  97.9 F (36.6 C)     TempSrc:  Axillary    SpO2:   91% 92%  Weight:      Height:       Eyes: lids and conjunctivae normal Neck: normal, supple Respiratory: clear to auscultation bilaterally. Normal respiratory effort. No accessory muscle use.  Cardiovascular: Regular rate and rhythm, no murmurs. Abdomen: no tenderness, no distention. Bowel sounds positive.  Musculoskeletal:  No edema. Skin: no rashes, lesions, ulcers.   Labs on Admission: I have personally reviewed following labs and imaging studies  CBC: Recent Labs  Lab 05/06/24 1445  WBC 6.8  NEUTROABS 3.7  HGB 14.7  HCT 43.4  MCV 100.0  PLT 364   Basic Metabolic Panel: Recent Labs  Lab 05/06/24 1445  NA 138  K 3.9  CL 103  CO2 22  GLUCOSE 141*  BUN 20  CREATININE 0.88  CALCIUM  8.8*   GFR: Estimated Creatinine Clearance: 73.7 mL/min (by C-G formula based on SCr of 0.88 mg/dL). Liver Function Tests: Recent Labs  Lab 05/06/24 1445  AST 22  ALT 25  ALKPHOS 85  BILITOT 0.7  PROT 7.5  ALBUMIN 3.0*   No results for input(s): LIPASE, AMYLASE in the last 168 hours. Recent Labs  Lab 05/06/24 1446  AMMONIA 19   Coagulation Profile: No results for input(s): INR, PROTIME in the last 168 hours. Cardiac Enzymes: No results for input(s): CKTOTAL, CKMB, CKMBINDEX, TROPONINI in the last 168 hours. BNP (last 3 results) No results for input(s): PROBNP in the last 8760 hours. HbA1C: No results for input(s): HGBA1C in the last 72 hours. CBG: No results for input(s): GLUCAP in the last 168 hours. Lipid Profile: No results for input(s): CHOL, HDL, LDLCALC, TRIG, CHOLHDL, LDLDIRECT in the last 72 hours. Thyroid  Function Tests: No results for input(s): TSH, T4TOTAL, FREET4, T3FREE, THYROIDAB in the last 72 hours. Anemia Panel: No results for input(s): VITAMINB12, FOLATE, FERRITIN, TIBC, IRON, RETICCTPCT in the last 72 hours. Urine analysis:    Component Value Date/Time    COLORURINE AMBER (A) 05/06/2024 1445   APPEARANCEUR HAZY (A) 05/06/2024 1445   LABSPEC 1.019 05/06/2024 1445   PHURINE 5.0 05/06/2024 1445   GLUCOSEU NEGATIVE 05/06/2024 1445   HGBUR SMALL (A) 05/06/2024 1445   BILIRUBINUR NEGATIVE 05/06/2024 1445   KETONESUR NEGATIVE 05/06/2024 1445   PROTEINUR 30 (A) 05/06/2024 1445   NITRITE POSITIVE (A) 05/06/2024 1445   LEUKOCYTESUR MODERATE (A) 05/06/2024 1445    Radiological Exams on Admission: CT Head Wo Contrast Result Date: 05/06/2024 CLINICAL DATA:  Delirium EXAM: CT HEAD WITHOUT CONTRAST TECHNIQUE: Contiguous axial images were obtained from the base of the skull through the vertex without intravenous contrast. RADIATION DOSE REDUCTION: This exam was performed according to the departmental dose-optimization program which includes automated exposure control, adjustment  of the mA and/or kV according to patient size and/or use of iterative reconstruction technique. COMPARISON:  April 14, 2024 FINDINGS: Brain: Proportional prominence of the ventricles and sulci, consistent with diffuse cerebral parenchymal volume loss. The ventricles otherwise maintained midline position without midline shift. Gray-white differentiation is preserved.Periventricular and subcortical white matter hypoattenuation, most consistent with changes of moderate chronic ischemic microvascular disease.No evidence of acute territorial infarction, extra-axial fluid collection, hemorrhage, or mass lesion. The basilar cisterns are patent without downward herniation. The cerebellar hemispheres and vermis are well formed without mass lesion or focal attenuation abnormality. Vascular: No hyperdense vessel. Calcified atherosclerotic plaque within the cavernous/supraclinoid ICA and intradural vertebral arteries. Skull: Normal. Negative for fracture or focal lesion. Sinuses/Orbits: The paranasal sinuses and mastoids are clear.Similar proptosis of both globes, which are otherwise intact. No retrobulbar  hematoma.Large volume of cerumen in the external auditory canals of both is. Other: None. IMPRESSION: 1. No acute intracranial abnormality, specifically, no acute hemorrhage, territorial infarction, or intracranial mass. 2. Global cerebral volume loss with sequelae of chronic ischemic microvascular disease. Electronically Signed   By: Rogelia Myers M.D.   On: 05/06/2024 16:31    EKG: Independently reviewed.  SR 76 bpm.  Assessment/Plan Principal Problem:   Acute metabolic encephalopathy Active Problems:   Dementia (HCC)   UTI (urinary tract infection)   Hyperlipidemia    Acute metabolic encephalopathy in the setting of Alzheimer's dementia with UTI - Previously noted to have E. coli UTI that was sensitive to Rocephin , continue the same - Monitor urine cultures - CT head with no acute findings - Ammonia within normal limits  Advanced Alzheimer's dementia with behavioral disturbances - Continue Aricept , Namenda , and Celexa  - Seroquel  as ordered previously - Ativan  as needed - Encourage palliative evaluation, likely outpatient  Dyslipidemia - Statin has been discontinued  History of chronic diastolic dysfunction Currently euvolemic and no signs of heart failure noted-  Generalized weakness with ambulatory dysfunction/recurrent falls -Patient's wife refused placement to memory unit/long-term care during last admission and refuses at this time - Fall precautions - Consider PT/OT evaluation as needed   DVT prophylaxis: Lovenox  Code Status: DNR Family Communication: Spouse at bedside Disposition Plan: Admitted for altered mentation secondary to UTI Consults called: None Admission status: Observation, MedSurg  Severity of Illness: The appropriate patient status for this patient is OBSERVATION. Observation status is judged to be reasonable and necessary in order to provide the required intensity of service to ensure the patient's safety. The patient's presenting symptoms,  physical exam findings, and initial radiographic and laboratory data in the context of their medical condition is felt to place them at decreased risk for further clinical deterioration. Furthermore, it is anticipated that the patient will be medically stable for discharge from the hospital within 2 midnights of admission.    Dung Prien D Maree DO [facility]  If 7PM-7AM, please contact night-coverage [email or web address]  05/06/2024, 5:16 PM

## 2024-05-06 NOTE — ED Notes (Signed)
 Patient transported to CT

## 2024-05-06 NOTE — Plan of Care (Signed)

## 2024-05-06 NOTE — ED Notes (Signed)
 EDP at bedside during triage

## 2024-05-06 NOTE — ED Provider Notes (Signed)
 [place] EMERGENCY DEPARTMENT AT [facility] Provider Note   CSN: [id number] Arrival date & time: 05/06/24  1435     Patient presents with: Altered Mental Status   Christopher Mccoy is a 78 y.o. male.   Patient is a 78 year old male who presents to the emergency department with altered mental status and possible UTI.  Family informed EMS that the patient has been more altered and combative over the past few days.  He did have his urine rechecked and was noted to have a urinary tract infection.  He has recently been on antibiotics for UTI.  On presentation the patient is altered, combative and agitated.  He has no signs of acute distress at this time.  Difficult to obtain the history given his history of dementia.   Altered Mental Status Presenting symptoms: confusion        Prior to Admission medications   Medication Sig Start Date End Date Taking? Authorizing Provider  acetaminophen  (TYLENOL ) 325 MG tablet Take 2 tablets (650 mg total) by mouth every 6 (six) hours as needed for mild pain (pain score 1-3) or fever (or Fever >/= 101). 04/17/24   Emokpae, Courage, MD  bisacodyl  (DULCOLAX) 10 MG suppository Place 1 suppository (10 mg total) rectally as needed for moderate constipation. 04/17/24   Pearlean Manus, MD  citalopram  (CELEXA ) 20 MG tablet Take 1 tablet (20 mg total) by mouth daily. 04/17/24 04/17/25  Pearlean Manus, MD  cyanocobalamin  1000 MCG tablet Take 1,000 mcg by mouth daily.    [provider]  donepezil  (ARICEPT ) 10 MG tablet Take 1 tablet (10 mg total) by mouth at bedtime. 04/17/24   Pearlean Manus, MD  memantine  (NAMENDA ) 10 MG tablet Take 1 tablet (10 mg total) by mouth 2 (two) times daily. 04/17/24   Pearlean Manus, MD  QUEtiapine  (SEROQUEL  XR) 50 MG TB24 24 hr tablet Take 2 tablets (100 mg total) by mouth at bedtime. 04/17/24   Pearlean Manus, MD  QUEtiapine  (SEROQUEL ) 50 MG tablet Take 1 tablet (50 mg total) by mouth See admin instructions. Take  100 mg at bedtime and 50 mg Qam--- 04/17/24   Pearlean Manus, MD  senna-docusate (SENOKOT-S) 8.6-50 MG tablet Take 2 tablets by mouth at bedtime. 04/17/24   Pearlean Manus, MD    Allergies: Penicillins    Review of Systems  Psychiatric/Behavioral:  Positive for confusion.   All other systems reviewed and are negative.   Updated Vital Signs Ht 5' 10 (1.778 m)   Wt 79.8 kg   SpO2 97%   BMI 25.24 kg/m   Physical Exam Vitals and nursing note reviewed.  Constitutional:      Appearance: Normal appearance.  HENT:     Head: Normocephalic and atraumatic.     Nose: Nose normal.     Mouth/Throat:     Mouth: Mucous membranes are moist.  Eyes:     Extraocular Movements: Extraocular movements intact.     Conjunctiva/sclera: Conjunctivae normal.     Pupils: Pupils are equal, round, and reactive to light.  Cardiovascular:     Rate and Rhythm: Normal rate and regular rhythm.     Pulses: Normal pulses.     Heart sounds: Normal heart sounds. No murmur heard.    No gallop.  Pulmonary:     Effort: Pulmonary effort is normal. No respiratory distress.     Breath sounds: Normal breath sounds. No stridor. No wheezing, rhonchi or rales.  Abdominal:     General: Abdomen is flat. Bowel sounds  are normal. There is no distension.     Palpations: Abdomen is soft.     Tenderness: There is no abdominal tenderness. There is no guarding.  Musculoskeletal:        General: Normal range of motion.     Cervical back: Normal range of motion and neck supple.  Skin:    General: Skin is warm and dry.  Neurological:     General: No focal deficit present.     Mental Status: He is alert and oriented to person, place, and time. Mental status is at baseline.     Cranial Nerves: No cranial nerve deficit.     Sensory: No sensory deficit.     Motor: No weakness.     Coordination: Coordination normal.  Psychiatric:        Mood and Affect: Affect is angry.        Behavior: Behavior is agitated and aggressive.      (all labs ordered are listed, but only abnormal results are displayed) Labs Reviewed  RESP PANEL BY RT-PCR (RSV, FLU A&B, COVID)  [id number]  COMPREHENSIVE METABOLIC PANEL WITH GFR  CBC WITH DIFFERENTIAL/PLATELET  URINALYSIS, ROUTINE W REFLEX MICROSCOPIC  AMMONIA  TROPONIN I (HIGH SENSITIVITY)    EKG: None  Radiology: No results found.   Procedures   Medications Ordered in the ED  LORazepam  (ATIVAN ) injection 1 mg (has no administration in time range)  cefTRIAXone  (ROCEPHIN ) 1 g in sodium chloride  0.9 % 100 mL IVPB (has no administration in time range)                                    Medical Decision Making Amount and/or Complexity of Data Reviewed Labs: ordered. Radiology: ordered.  Risk Prescription drug management. Decision regarding hospitalization.   This patient presents to the ED for concern of altered mental status, this involves an extensive number of treatment options, and is a complaint that carries with it a high risk of complications and morbidity.  The differential diagnosis includes sepsis, CVA, TIA, meningitis, electrolyte derangement, acute kidney injury, medication reaction, dementia, urinary tract infection   Co morbidities that complicate the patient evaluation  History of dementia and urinary tract infections   Additional history obtained:  Additional history obtained from family External records from outside source obtained and reviewed including medical records   Lab Tests:  I Ordered, and personally interpreted labs.  The pertinent results include: No leukocytosis, no anemia, normal kidney function liver function, normal electrolytes, negative troponin, urinalysis with positive nitrite and moderate leukocytes, negative viral swab, negative ammonia   Imaging Studies ordered:  I ordered imaging studies including CT scan of head I independently visualized and interpreted imaging which showed no acute intracranial process I agree  with the radiologist interpretation   Cardiac Monitoring: / EKG:  The patient was maintained on a cardiac monitor.  I personally viewed and interpreted the cardiac monitored which showed an underlying rhythm of: Normal sinus rhythm, no ST/T wave changes, no ischemic changes, no STEMI   Consultations Obtained:  I requested consultation with the hospitalist,  and discussed lab and imaging findings as well as pertinent plan - they recommend: Admission   Problem List / ED Course / Critical interventions / Medication management  Patient is doing well at this time and does remain stable.  His agitation has improved with a dose of Ativan .  He has been given a dose of  Rocephin  for his urinary tract infection.  CT scan of the head was unremarkable.  Blood work is otherwise unremarkable as well.  Low suspicion for meningitis at this time.  Patient has an otherwise benign physical exam at this point and do not suspect another surgical process.  Have discussed patient case with Dr. Maree with the hospitalist service who has excepted for admission at this time. I ordered medication including Rocephin , IV fluids, Ativan  for urinary tract infection, agitation Reevaluation of the patient after these medicines showed that the patient improved I have reviewed the patients home medicines and have made adjustments as needed   Social Determinants of Health:  None   Test / Admission - Considered:  Admission     Final diagnoses:  None    ED Discharge Orders     None          Daralene Lonni JONETTA DEVONNA 05/06/24 1710    Freddi Hamilton, MD 05/06/24 1749

## 2024-05-06 NOTE — ED Notes (Signed)
**Note De-identified Whittaker Obfuscation** Report given to 300 RN. 

## 2024-05-06 NOTE — ED Triage Notes (Signed)
 Patient BIB [facility] from home for complaint ofd UTI per Home health nurse Urine showed UTI. Was hospitalized for UTI. Family informed EMS that patient was given ativan  last visit for agitation and dementia, Haldol  report to worsen symptoms. EMS vital 108/72 76 pulse 92% room air, temp 97.6.

## 2024-05-06 NOTE — Plan of Care (Signed)
  Problem: Activity: Goal: Risk for activity intolerance will decrease Outcome: Progressing   Problem: Safety: Goal: Ability to remain free from injury will improve Outcome: Progressing   

## 2024-05-07 DIAGNOSIS — Z1152 Encounter for screening for COVID-19: Secondary | ICD-10-CM | POA: Diagnosis not present

## 2024-05-07 DIAGNOSIS — Z66 Do not resuscitate: Secondary | ICD-10-CM | POA: Diagnosis present

## 2024-05-07 DIAGNOSIS — A419 Sepsis, unspecified organism: Secondary | ICD-10-CM | POA: Diagnosis not present

## 2024-05-07 DIAGNOSIS — A4151 Sepsis due to Escherichia coli [E. coli]: Secondary | ICD-10-CM | POA: Diagnosis present

## 2024-05-07 DIAGNOSIS — G309 Alzheimer's disease, unspecified: Secondary | ICD-10-CM

## 2024-05-07 DIAGNOSIS — F02B0 Dementia in other diseases classified elsewhere, moderate, without behavioral disturbance, psychotic disturbance, mood disturbance, and anxiety: Secondary | ICD-10-CM

## 2024-05-07 DIAGNOSIS — R296 Repeated falls: Secondary | ICD-10-CM | POA: Diagnosis present

## 2024-05-07 DIAGNOSIS — Z880 Allergy status to penicillin: Secondary | ICD-10-CM | POA: Diagnosis not present

## 2024-05-07 DIAGNOSIS — Z9181 History of falling: Secondary | ICD-10-CM | POA: Diagnosis not present

## 2024-05-07 DIAGNOSIS — E785 Hyperlipidemia, unspecified: Secondary | ICD-10-CM | POA: Diagnosis present

## 2024-05-07 DIAGNOSIS — N390 Urinary tract infection, site not specified: Secondary | ICD-10-CM | POA: Diagnosis not present

## 2024-05-07 DIAGNOSIS — Z79899 Other long term (current) drug therapy: Secondary | ICD-10-CM | POA: Diagnosis not present

## 2024-05-07 DIAGNOSIS — F0284 Dementia in other diseases classified elsewhere, unspecified severity, with anxiety: Secondary | ICD-10-CM | POA: Diagnosis present

## 2024-05-07 DIAGNOSIS — F02818 Dementia in other diseases classified elsewhere, unspecified severity, with other behavioral disturbance: Secondary | ICD-10-CM | POA: Diagnosis present

## 2024-05-07 DIAGNOSIS — Z806 Family history of leukemia: Secondary | ICD-10-CM | POA: Diagnosis not present

## 2024-05-07 DIAGNOSIS — Z8249 Family history of ischemic heart disease and other diseases of the circulatory system: Secondary | ICD-10-CM | POA: Diagnosis not present

## 2024-05-07 DIAGNOSIS — F05 Delirium due to known physiological condition: Secondary | ICD-10-CM | POA: Diagnosis present

## 2024-05-07 DIAGNOSIS — Z888 Allergy status to other drugs, medicaments and biological substances status: Secondary | ICD-10-CM | POA: Diagnosis not present

## 2024-05-07 DIAGNOSIS — G9341 Metabolic encephalopathy: Secondary | ICD-10-CM | POA: Diagnosis not present

## 2024-05-07 DIAGNOSIS — I5032 Chronic diastolic (congestive) heart failure: Secondary | ICD-10-CM | POA: Diagnosis present

## 2024-05-07 DIAGNOSIS — Z87891 Personal history of nicotine dependence: Secondary | ICD-10-CM | POA: Diagnosis not present

## 2024-05-07 LAB — CBC
HCT: 41.7 % (ref 39.0–52.0)
Hemoglobin: 13.8 g/dL (ref 13.0–17.0)
MCH: 33.9 pg (ref 26.0–34.0)
MCHC: 33.1 g/dL (ref 30.0–36.0)
MCV: 102.5 fL — ABNORMAL HIGH (ref 80.0–100.0)
Platelets: 316 K/uL (ref 150–400)
RBC: 4.07 MIL/uL — ABNORMAL LOW (ref 4.22–5.81)
RDW: 12 % (ref 11.5–15.5)
WBC: 6.1 K/uL (ref 4.0–10.5)
nRBC: 0 % (ref 0.0–0.2)

## 2024-05-07 LAB — BASIC METABOLIC PANEL WITH GFR
Anion gap: 9 (ref 5–15)
BUN: 20 mg/dL (ref 8–23)
CO2: 25 mmol/L (ref 22–32)
Calcium: 8.6 mg/dL — ABNORMAL LOW (ref 8.9–10.3)
Chloride: 107 mmol/L (ref 98–111)
Creatinine, Ser: 0.84 mg/dL (ref 0.61–1.24)
GFR, Estimated: >60 mL/min (ref >60)
Glucose, Bld: 97 mg/dL (ref 70–99)
Potassium: 4.3 mmol/L (ref 3.5–5.1)
Sodium: 141 mmol/L (ref 135–145)

## 2024-05-07 LAB — MAGNESIUM: Magnesium: 2.1 mg/dL (ref 1.7–2.4)

## 2024-05-07 MED ORDER — LORAZEPAM 2 MG/ML IJ SOLN
1.0000 mg | Freq: Once | INTRAMUSCULAR | Status: AC
  Administered 2024-05-07: 1 mg via INTRAVENOUS
  Filled 2024-05-07: qty 1

## 2024-05-07 MED ORDER — SODIUM CHLORIDE 0.9 % IV SOLN
2.0000 g | INTRAVENOUS | Status: DC
  Administered 2024-05-07 – 2024-05-09 (×3): 2 g via INTRAVENOUS
  Filled 2024-05-07 (×3): qty 20

## 2024-05-07 NOTE — Hospital Course (Signed)
 78 y.o. male with medical history significant for Alzheimer's dementia with behavioral disturbances, dyslipidemia, chronic diastolic CHF, and recent discharge on 6/29 after admission for acute metabolic encephalopathy with E. coli UTI.  He was brought to the ED due to altered mentation and combativeness over the past few days according to his wife at bedside.  He had his urine rechecked by home health nurse and was noted to have UTI and apparently urine culture had come back positive for E. coli, but he was brought to the ED prior to initiating treatment at home.  He has not been eating or drinking reliably at home and has not been taking his home medications reliably as well.  Placement as well as consultation with palliative was discussed on recent admission, and wife was reminded of this and refuses both placement or palliative consultation at this time.  Urinalysis positive for UTI and patient started on Rocephin  in the ED.  He is severely demented and had agitation.

## 2024-05-07 NOTE — Progress Notes (Signed)
 PROGRESS NOTE   Christopher Mccoy  FMW:[id number] DOB: Apr 07, 1946 DOA: 05/06/2024 PCP: [facility]   Chief Complaint  Patient presents with   Altered Mental Status   Level of care: Med-Surg  Brief Admission History:  78 y.o. male with medical history significant for Alzheimer's dementia with behavioral disturbances, dyslipidemia, chronic diastolic CHF, and recent discharge on 6/29 after admission for acute metabolic encephalopathy with E. coli UTI.  He was brought to the ED due to altered mentation and combativeness over the past few days according to his wife at bedside.  He had his urine rechecked by home health nurse and was noted to have UTI and apparently urine culture had come back positive for E. coli, but he was brought to the ED prior to initiating treatment at home.  He has not been eating or drinking reliably at home and has not been taking his home medications reliably as well.  Placement as well as consultation with palliative was discussed on recent admission, and wife was reminded of this and refuses both placement or palliative consultation at this time.  Urinalysis positive for UTI and patient started on Rocephin  in the ED.  He is severely demented and had agitation.    Assessment and Plan:  UTI - urinalysis strongly positive - suspect recurrent E coli infection  - agree with IV ceftriaxone , increase dose to 2 gram as no blood cultures collected to rule out bacteremia - follow urine culture results  Acute metabolic encephalopathy - precipitated by UTI  - continue treatment as noted above - anticipating acute hospital delirium as he had with last recent hospitalization - delirium precautions ordered - wife at bedside as much as able  Advanced Alzheimer's dementia with behavioral disturbances - Continue Aricept , Namenda , and Celexa  - Seroquel  as ordered previously - Ativan  as needed - Encourage palliative evaluation, likely outpatient   Dyslipidemia -  Statin has been discontinued prior to arrival    History of chronic diastolic dysfunction Currently euvolemic and no signs of acute decompensated heart failure   Generalized weakness with ambulatory dysfunction/recurrent falls -Patient's wife refused placement to memory unit/long-term care during last admission and refuses at this time - Fall precautions as he is a high fall risk - Consider PT/OT evaluation as needed  DVT prophylaxis: enoxaparin   Code Status: DNR  Family Communication: wife at bedside Disposition: wife insists to take home    Consultants:   Procedures:   Antimicrobials:  Ceftriaxone  7/18>>   Subjective: Pt was up most of the night with severe agitation and now medicated and sedated.   Objective: Vitals:   05/06/24 1817 05/06/24 2321 05/07/24 0138 05/07/24 0554  BP: 105/70 (!) 127/94 122/78 115/70  Pulse: 70 66 94 (!) 55  Resp: 16 18 20 16   Temp: 97.9 F (36.6 C) 97.7 F (36.5 C) 98.4 F (36.9 C) 97.6 F (36.4 C)  TempSrc: Oral Axillary Axillary Axillary  SpO2: 94% 100% 92% 95%  Weight:      Height:        Intake/Output Summary (Last 24 hours) at 05/07/2024 1213 Last data filed at 05/07/2024 0750 Gross per 24 hour  Intake 691.6 ml  Output --  Net 691.6 ml   Filed Weights   05/06/24 1444  Weight: 79.8 kg   Examination:  General exam: pt somnolent with intermittent agitation and delirious.   Respiratory system: Clear to auscultation. Respiratory effort normal. Cardiovascular system: normal S1 & S2 heard. No JVD, murmurs, rubs, gallops or clicks. No pedal edema.  Gastrointestinal system: Abdomen is nondistended, soft and nontender. No organomegaly or masses felt. Normal bowel sounds heard. Central nervous system: somnolent but arousable. No focal neurological deficits. Extremities: Symmetric 5 x 5 power. Skin: No rashes, lesions or ulcers. Psychiatry: Judgement and insight appear severely diminished.  Data Reviewed: I have personally reviewed  following labs and imaging studies  CBC: Recent Labs  Lab 05/06/24 1445 05/07/24 0356  WBC 6.8 6.1  NEUTROABS 3.7  --   HGB 14.7 13.8  HCT 43.4 41.7  MCV 100.0 102.5*  PLT 364 316    Basic Metabolic Panel: Recent Labs  Lab 05/06/24 1445 05/07/24 0356  NA 138 141  K 3.9 4.3  CL 103 107  CO2 22 25  GLUCOSE 141* 97  BUN 20 20  CREATININE 0.88 0.84  CALCIUM  8.8* 8.6*  MG  --  2.1    CBG: No results for input(s): GLUCAP in the last 168 hours.  Recent Results (from the past 240 hours)  Resp panel by RT-PCR (RSV, Flu A&B, Covid) Anterior Nasal Swab     Status: None   Collection Time: 05/06/24  2:45 PM   Specimen: Anterior Nasal Swab  Result Value Ref Range Status   SARS Coronavirus 2 by RT PCR NEGATIVE NEGATIVE Final    Comment: (NOTE) SARS-CoV-2 target nucleic acids are NOT DETECTED.  The SARS-CoV-2 RNA is generally detectable in upper respiratory specimens during the acute phase of infection. The lowest concentration of SARS-CoV-2 viral copies this assay can detect is 138 copies/mL. A negative result does not preclude SARS-Cov-2 infection and should not be used as the sole basis for treatment or other patient management decisions. A negative result may occur with  improper specimen collection/handling, submission of specimen other than nasopharyngeal swab, presence of viral mutation(s) within the areas targeted by this assay, and inadequate number of viral copies(<138 copies/mL). A negative result must be combined with clinical observations, patient history, and epidemiological information. The expected result is Negative.  Fact Sheet for Patients:  BloggerCourse.com  Fact Sheet for Healthcare Providers:  SeriousBroker.it  This test is no t yet approved or cleared by the [place]  FDA and  has been authorized for detection and/or diagnosis of SARS-CoV-2 by FDA under an Emergency Use Authorization (EUA).  This EUA will remain  in effect (meaning this test can be used) for the duration of the COVID-19 declaration under Section 564(b)(1) of the Act, 21 U.S.C.section 360bbb-3(b)(1), unless the authorization is terminated  or revoked sooner.       Influenza A by PCR NEGATIVE NEGATIVE Final   Influenza B by PCR NEGATIVE NEGATIVE Final    Comment: (NOTE) The Xpert Xpress SARS-CoV-2/FLU/RSV plus assay is intended as an aid in the diagnosis of influenza from Nasopharyngeal swab specimens and should not be used as a sole basis for treatment. Nasal washings and aspirates are unacceptable for Xpert Xpress SARS-CoV-2/FLU/RSV testing.  Fact Sheet for Patients: BloggerCourse.com  Fact Sheet for Healthcare Providers: SeriousBroker.it  This test is not yet approved or cleared by the [place]  FDA and has been authorized for detection and/or diagnosis of SARS-CoV-2 by FDA under an Emergency Use Authorization (EUA). This EUA will remain in effect (meaning this test can be used) for the duration of the COVID-19 declaration under Section 564(b)(1) of the Act, 21 U.S.C. section 360bbb-3(b)(1), unless the authorization is terminated or revoked.     Resp Syncytial Virus by PCR NEGATIVE NEGATIVE Final    Comment: (NOTE) Fact Sheet for Patients: BloggerCourse.com  Fact Sheet for Healthcare Providers: SeriousBroker.it  This test is not yet approved or cleared by the [place]  FDA and has been authorized for detection and/or diagnosis of SARS-CoV-2 by FDA under an Emergency Use Authorization (EUA). This EUA will remain in effect (meaning this test can be used) for the duration of the COVID-19 declaration under Section 564(b)(1) of the Act, 21 U.S.C. section 360bbb-3(b)(1), unless the authorization is terminated or revoked.  Performed at [facility], [address], [place], [place]  [address]      Radiology Studies: CT Head Wo Contrast Result Date: 05/06/2024 CLINICAL DATA:  Delirium EXAM: CT HEAD WITHOUT CONTRAST TECHNIQUE: Contiguous axial images were obtained from the base of the skull through the vertex without intravenous contrast. RADIATION DOSE REDUCTION: This exam was performed according to the departmental dose-optimization program which includes automated exposure control, adjustment of the mA and/or kV according to patient size and/or use of iterative reconstruction technique. COMPARISON:  April 14, 2024 FINDINGS: Brain: Proportional prominence of the ventricles and sulci, consistent with diffuse cerebral parenchymal volume loss. The ventricles otherwise maintained midline position without midline shift. Gray-white differentiation is preserved.Periventricular and subcortical white matter hypoattenuation, most consistent with changes of moderate chronic ischemic microvascular disease.No evidence of acute territorial infarction, extra-axial fluid collection, hemorrhage, or mass lesion. The basilar cisterns are patent without downward herniation. The cerebellar hemispheres and vermis are well formed without mass lesion or focal attenuation abnormality. Vascular: No hyperdense vessel. Calcified atherosclerotic plaque within the cavernous/supraclinoid ICA and intradural vertebral arteries. Skull: Normal. Negative for fracture or focal lesion. Sinuses/Orbits: The paranasal sinuses and mastoids are clear.Similar proptosis of both globes, which are otherwise intact. No retrobulbar hematoma.Large volume of cerumen in the external auditory canals of both is. Other: None. IMPRESSION: 1. No acute intracranial abnormality, specifically, no acute hemorrhage, territorial infarction, or intracranial mass. 2. Global cerebral volume loss with sequelae of chronic ischemic microvascular disease. Electronically Signed   By: Rogelia Myers M.D.   On: 05/06/2024 16:31   Scheduled Meds:  citalopram   20  mg Oral Daily   cyanocobalamin   1,000 mcg Oral Daily   donepezil   10 mg Oral QHS   enoxaparin  (LOVENOX ) injection  40 mg Subcutaneous Q24H   memantine   10 mg Oral BID   QUEtiapine   150 mg Oral QHS   QUEtiapine   50 mg Oral q morning   Continuous Infusions:  cefTRIAXone  (ROCEPHIN )  IV      LOS: 0 days   Time spent: 41 mins  Deirdra Heumann Vicci, MD How to contact the [facility] Attending or Consulting provider 7A - 7P or covering provider during after hours 7P -7A, for this patient?  Check the care team in [facility] and look for a) attending/consulting [facility] provider listed and b) the [facility] team listed Log into [email or web address] to find provider on call.  Locate the [facility] provider you are looking for under [facility] and page to a number that you can be directly reached. If you still have difficulty reaching the provider, please page the [facility] (Director on Call) for the Hospitalists listed on amion for assistance.  05/07/2024, 12:13 PM

## 2024-05-07 NOTE — Plan of Care (Signed)
  Problem: Education: Goal: Knowledge of General Education information will improve Description: Including pain rating scale, medication(s)/side effects and non-pharmacologic comfort measures 05/07/2024 0729 by Mathews Norleen POUR, RN Outcome: Progressing 05/06/2024 1818 by Mathews Norleen POUR, RN Outcome: Progressing   Problem: Health Behavior/Discharge Planning: Goal: Ability to manage health-related needs will improve 05/07/2024 0729 by Mathews Norleen POUR, RN Outcome: Progressing 05/06/2024 1818 by Mathews Norleen POUR, RN Outcome: Progressing   Problem: Clinical Measurements: Goal: Ability to maintain clinical measurements within normal limits will improve 05/07/2024 0729 by Mathews Norleen POUR, RN Outcome: Progressing 05/06/2024 1818 by Mathews Norleen POUR, RN Outcome: Progressing Goal: Will remain free from infection 05/07/2024 0729 by Mathews Norleen POUR, RN Outcome: Progressing 05/06/2024 1818 by Mathews Norleen POUR, RN Outcome: Progressing Goal: Diagnostic test results will improve 05/07/2024 0729 by Mathews Norleen POUR, RN Outcome: Progressing 05/06/2024 1818 by Mathews Norleen POUR, RN Outcome: Progressing Goal: Respiratory complications will improve 05/07/2024 0729 by Mathews Norleen POUR, RN Outcome: Progressing 05/06/2024 1818 by Mathews Norleen POUR, RN Outcome: Progressing Goal: Cardiovascular complication will be avoided 05/07/2024 0729 by Mathews Norleen POUR, RN Outcome: Progressing 05/06/2024 1818 by Mathews Norleen POUR, RN Outcome: Progressing   Problem: Activity: Goal: Risk for activity intolerance will decrease 05/07/2024 0729 by Mathews Norleen POUR, RN Outcome: Progressing 05/06/2024 1818 by Mathews Norleen POUR, RN Outcome: Progressing   Problem: Nutrition: Goal: Adequate nutrition will be maintained 05/07/2024 0729 by Mathews Norleen POUR, RN Outcome: Progressing 05/06/2024 1818 by Mathews Norleen POUR, RN Outcome: Progressing   Problem: Coping: Goal: Level of anxiety will decrease 05/07/2024 0729 by Mathews Norleen POUR, RN Outcome: Progressing 05/06/2024 1818 by Mathews Norleen POUR, RN Outcome: Progressing   Problem: Elimination: Goal: Will not experience complications related to bowel motility 05/07/2024 0729 by Mathews Norleen POUR, RN Outcome: Progressing 05/06/2024 1818 by Mathews Norleen POUR, RN Outcome: Progressing Goal: Will not experience complications related to urinary retention 05/07/2024 0729 by Mathews Norleen POUR, RN Outcome: Progressing 05/06/2024 1818 by Mathews Norleen POUR, RN Outcome: Progressing   Problem: Pain Managment: Goal: General experience of comfort will improve and/or be controlled 05/07/2024 0729 by Mathews Norleen POUR, RN Outcome: Progressing 05/06/2024 1818 by Mathews Norleen POUR, RN Outcome: Progressing   Problem: Safety: Goal: Ability to remain free from injury will improve 05/07/2024 0729 by Mathews Norleen POUR, RN Outcome: Progressing 05/06/2024 1818 by Mathews Norleen POUR, RN Outcome: Progressing   Problem: Skin Integrity: Goal: Risk for impaired skin integrity will decrease 05/07/2024 0729 by Mathews Norleen POUR, RN Outcome: Progressing 05/06/2024 1818 by Mathews Norleen POUR, RN Outcome: Progressing

## 2024-05-07 NOTE — Care Management Obs Status (Signed)
 MEDICARE OBSERVATION STATUS NOTIFICATION   Patient Details  Name: Christopher Mccoy MRN: [id number] Date of Birth: 01-04-1946   Medicare Observation Status Notification Given:  Yes    Nena LITTIE Coffee, RN 05/07/2024, 11:54 AM

## 2024-05-08 DIAGNOSIS — G309 Alzheimer's disease, unspecified: Secondary | ICD-10-CM | POA: Diagnosis not present

## 2024-05-08 DIAGNOSIS — F02B0 Dementia in other diseases classified elsewhere, moderate, without behavioral disturbance, psychotic disturbance, mood disturbance, and anxiety: Secondary | ICD-10-CM | POA: Diagnosis not present

## 2024-05-08 DIAGNOSIS — G9341 Metabolic encephalopathy: Secondary | ICD-10-CM | POA: Diagnosis not present

## 2024-05-08 LAB — BASIC METABOLIC PANEL WITH GFR
Anion gap: 8 (ref 5–15)
BUN: 22 mg/dL (ref 8–23)
CO2: 25 mmol/L (ref 22–32)
Calcium: 8.3 mg/dL — ABNORMAL LOW (ref 8.9–10.3)
Chloride: 107 mmol/L (ref 98–111)
Creatinine, Ser: 0.84 mg/dL (ref 0.61–1.24)
GFR, Estimated: >60 mL/min (ref >60)
Glucose, Bld: 98 mg/dL (ref 70–99)
Potassium: 3.2 mmol/L — ABNORMAL LOW (ref 3.5–5.1)
Sodium: 140 mmol/L (ref 135–145)

## 2024-05-08 MED ORDER — POTASSIUM CHLORIDE CRYS ER 20 MEQ PO TBCR
40.0000 meq | EXTENDED_RELEASE_TABLET | Freq: Once | ORAL | Status: AC
  Administered 2024-05-08: 40 meq via ORAL
  Filled 2024-05-08: qty 2

## 2024-05-08 NOTE — Progress Notes (Signed)
 PROGRESS NOTE   Christopher Mccoy  FMW:[id number] DOB: 05-Jun-1946 DOA: 05/06/2024 PCP: [facility]   Chief Complaint  Patient presents with   Altered Mental Status   Level of care: Med-Surg  Brief Admission History:  78 y.o. male with medical history significant for Alzheimer's dementia with behavioral disturbances, dyslipidemia, chronic diastolic CHF, and recent discharge on 6/29 after admission for acute metabolic encephalopathy with E. coli UTI.  He was brought to the ED due to altered mentation and combativeness over the past few days according to his wife at bedside.  He had his urine rechecked by home health nurse and was noted to have UTI and apparently urine culture had come back positive for E. coli, but he was brought to the ED prior to initiating treatment at home.  He has not been eating or drinking reliably at home and has not been taking his home medications reliably as well.  Placement as well as consultation with palliative was discussed on recent admission, and wife was reminded of this and refuses both placement or palliative consultation at this time.  Urinalysis positive for UTI and patient started on Rocephin  in the ED.  He is severely demented and had agitation.    Assessment and Plan:  UTI - urinalysis strongly positive - suspect recurrent E coli infection  - agree with IV ceftriaxone , increase dose to 2 gram as no blood cultures collected to rule out bacteremia - follow urine culture results - still pending at this time   Acute metabolic encephalopathy - precipitated by UTI in the setting of advanced dementia  - continue treatment as noted above - anticipating acute hospital delirium as he had with last recent hospitalization - delirium precautions ordered - wife at bedside as much as able  Advanced Alzheimer's dementia with behavioral disturbances - Continue Aricept , Namenda , and Celexa  - Seroquel  as ordered previously - Ativan  as needed - Encourage  palliative evaluation, likely outpatient   Dyslipidemia - Statin has been discontinued prior to arrival    History of chronic diastolic dysfunction Currently euvolemic and no signs of acute decompensated heart failure   Generalized weakness with ambulatory dysfunction/recurrent falls -Patient's wife refused placement to memory unit/long-term care during last admission and refuses at this time - Fall precautions as he is a high fall risk - Consider PT/OT evaluation as needed  DVT prophylaxis: enoxaparin   Code Status: DNR  Family Communication: wife at bedside Disposition: wife insists to take home and declined SNF   Consultants:   Procedures:   Antimicrobials:  Ceftriaxone  7/18>>   Subjective: Pt remains very confused and delirious.   Objective: Vitals:   05/07/24 1312 05/07/24 2141 05/08/24 0341 05/08/24 1339  BP: 96/63 (!) 137/96 (!) 143/86 100/81  Pulse: 65 98 70 94  Resp:  20 18   Temp: 97.9 F (36.6 C) 99.2 F (37.3 C) 98.2 F (36.8 C) 98.6 F (37 C)  TempSrc: Axillary Oral  Oral  SpO2: 100% 93% 96% 100%  Weight:      Height:        Intake/Output Summary (Last 24 hours) at 05/08/2024 1524 Last data filed at 05/08/2024 1300 Gross per 24 hour  Intake 660 ml  Output 950 ml  Net -290 ml   Filed Weights   05/06/24 1444  Weight: 79.8 kg   Examination:  General exam: pt somnolent with intermittent agitation and delirious.   Respiratory system: Clear to auscultation. Respiratory effort normal. Cardiovascular system: normal S1 & S2 heard. No JVD, murmurs,  rubs, gallops or clicks. No pedal edema. Gastrointestinal system: Abdomen is nondistended, soft and nontender. No organomegaly or masses felt. Normal bowel sounds heard. Central nervous system: somnolent but arousable. No focal neurological deficits. Extremities: Symmetric 5 x 5 power. Skin: No rashes, lesions or ulcers. Psychiatry: Judgement and insight appear severely diminished.  Data Reviewed: I have  personally reviewed following labs and imaging studies  CBC: Recent Labs  Lab 05/06/24 1445 05/07/24 0356  WBC 6.8 6.1  NEUTROABS 3.7  --   HGB 14.7 13.8  HCT 43.4 41.7  MCV 100.0 102.5*  PLT 364 316    Basic Metabolic Panel: Recent Labs  Lab 05/06/24 1445 05/07/24 0356 05/08/24 0403  NA 138 141 140  K 3.9 4.3 3.2*  CL 103 107 107  CO2 22 25 25   GLUCOSE 141* 97 98  BUN 20 20 22   CREATININE 0.88 0.84 0.84  CALCIUM  8.8* 8.6* 8.3*  MG  --  2.1  --     CBG: No results for input(s): GLUCAP in the last 168 hours.  Recent Results (from the past 240 hours)  Resp panel by RT-PCR (RSV, Flu A&B, Covid) Anterior Nasal Swab     Status: None   Collection Time: 05/06/24  2:45 PM   Specimen: Anterior Nasal Swab  Result Value Ref Range Status   SARS Coronavirus 2 by RT PCR NEGATIVE NEGATIVE Final    Comment: (NOTE) SARS-CoV-2 target nucleic acids are NOT DETECTED.  The SARS-CoV-2 RNA is generally detectable in upper respiratory specimens during the acute phase of infection. The lowest concentration of SARS-CoV-2 viral copies this assay can detect is 138 copies/mL. A negative result does not preclude SARS-Cov-2 infection and should not be used as the sole basis for treatment or other patient management decisions. A negative result may occur with  improper specimen collection/handling, submission of specimen other than nasopharyngeal swab, presence of viral mutation(s) within the areas targeted by this assay, and inadequate number of viral copies(<138 copies/mL). A negative result must be combined with clinical observations, patient history, and epidemiological information. The expected result is Negative.  Fact Sheet for Patients:  BloggerCourse.com  Fact Sheet for Healthcare Providers:  SeriousBroker.it  This test is no t yet approved or cleared by the [place]  FDA and  has been authorized for detection and/or  diagnosis of SARS-CoV-2 by FDA under an Emergency Use Authorization (EUA). This EUA will remain  in effect (meaning this test can be used) for the duration of the COVID-19 declaration under Section 564(b)(1) of the Act, 21 U.S.C.section 360bbb-3(b)(1), unless the authorization is terminated  or revoked sooner.       Influenza A by PCR NEGATIVE NEGATIVE Final   Influenza B by PCR NEGATIVE NEGATIVE Final    Comment: (NOTE) The Xpert Xpress SARS-CoV-2/FLU/RSV plus assay is intended as an aid in the diagnosis of influenza from Nasopharyngeal swab specimens and should not be used as a sole basis for treatment. Nasal washings and aspirates are unacceptable for Xpert Xpress SARS-CoV-2/FLU/RSV testing.  Fact Sheet for Patients: BloggerCourse.com  Fact Sheet for Healthcare Providers: SeriousBroker.it  This test is not yet approved or cleared by the [place]  FDA and has been authorized for detection and/or diagnosis of SARS-CoV-2 by FDA under an Emergency Use Authorization (EUA). This EUA will remain in effect (meaning this test can be used) for the duration of the COVID-19 declaration under Section 564(b)(1) of the Act, 21 U.S.C. section 360bbb-3(b)(1), unless the authorization is terminated or revoked.  Resp Syncytial Virus by PCR NEGATIVE NEGATIVE Final    Comment: (NOTE) Fact Sheet for Patients: BloggerCourse.com  Fact Sheet for Healthcare Providers: SeriousBroker.it  This test is not yet approved or cleared by the [place]  FDA and has been authorized for detection and/or diagnosis of SARS-CoV-2 by FDA under an Emergency Use Authorization (EUA). This EUA will remain in effect (meaning this test can be used) for the duration of the COVID-19 declaration under Section 564(b)(1) of the Act, 21 U.S.C. section 360bbb-3(b)(1), unless the authorization is terminated  or revoked.  Performed at [facility], [address] [place], [place] [address]      Radiology Studies: CT Head Wo Contrast Result Date: 05/06/2024 CLINICAL DATA:  Delirium EXAM: CT HEAD WITHOUT CONTRAST TECHNIQUE: Contiguous axial images were obtained from the base of the skull through the vertex without intravenous contrast. RADIATION DOSE REDUCTION: This exam was performed according to the departmental dose-optimization program which includes automated exposure control, adjustment of the mA and/or kV according to patient size and/or use of iterative reconstruction technique. COMPARISON:  April 14, 2024 FINDINGS: Brain: Proportional prominence of the ventricles and sulci, consistent with diffuse cerebral parenchymal volume loss. The ventricles otherwise maintained midline position without midline shift. Gray-white differentiation is preserved.Periventricular and subcortical white matter hypoattenuation, most consistent with changes of moderate chronic ischemic microvascular disease.No evidence of acute territorial infarction, extra-axial fluid collection, hemorrhage, or mass lesion. The basilar cisterns are patent without downward herniation. The cerebellar hemispheres and vermis are well formed without mass lesion or focal attenuation abnormality. Vascular: No hyperdense vessel. Calcified atherosclerotic plaque within the cavernous/supraclinoid ICA and intradural vertebral arteries. Skull: Normal. Negative for fracture or focal lesion. Sinuses/Orbits: The paranasal sinuses and mastoids are clear.Similar proptosis of both globes, which are otherwise intact. No retrobulbar hematoma.Large volume of cerumen in the external auditory canals of both is. Other: None. IMPRESSION: 1. No acute intracranial abnormality, specifically, no acute hemorrhage, territorial infarction, or intracranial mass. 2. Global cerebral volume loss with sequelae of chronic ischemic microvascular disease. Electronically Signed   By:  Rogelia Myers M.D.   On: 05/06/2024 16:31   Scheduled Meds:  citalopram   20 mg Oral Daily   cyanocobalamin   1,000 mcg Oral Daily   donepezil   10 mg Oral QHS   enoxaparin  (LOVENOX ) injection  40 mg Subcutaneous Q24H   memantine   10 mg Oral BID   QUEtiapine   150 mg Oral QHS   QUEtiapine   50 mg Oral q morning   Continuous Infusions:  cefTRIAXone  (ROCEPHIN )  IV 2 g (05/08/24 1432)    LOS: 1 day   Time spent: 50 mins  Zuzanna Maroney Vicci, MD How to contact the [facility] Attending or Consulting provider 7A - 7P or covering provider during after hours 7P -7A, for this patient?  Check the care team in [facility] and look for a) attending/consulting TRH provider listed and b) the TRH team listed Log into [email or web address] to find provider on call.  Locate the TRH provider you are looking for under [facility] and page to a number that you can be directly reached. If you still have difficulty reaching the provider, please page the [facility] (Director on Call) for the Hospitalists listed on amion for assistance.  05/08/2024, 3:24 PM

## 2024-05-08 NOTE — Progress Notes (Signed)
   05/08/24 1336  TOC Brief Assessment  Insurance and Status Reviewed  Patient has primary care physician Yes  Home environment has been reviewed From home c/wife  Prior level of function: Max assist  Prior/Current Home Services Current home services Psychologist, counselling HH ([facility]) and an aide.)  Social Drivers of Health Review SDOH reviewed no interventions necessary  Readmission risk has been reviewed Yes  Transition of care needs no transition of care needs at this time   Spoke c/wife at bedside and she is knowledgeable of services offered by [facility] and how to obtain. Wife states there are no current DME needs.

## 2024-05-08 NOTE — Plan of Care (Signed)
   Problem: Education: Goal: Knowledge of General Education information will improve Description: Including pain rating scale, medication(s)/side effects and non-pharmacologic comfort measures Outcome: Progressing   Problem: Clinical Measurements: Goal: Ability to maintain clinical measurements within normal limits will improve Outcome: Progressing Goal: Diagnostic test results will improve Outcome: Progressing

## 2024-05-08 NOTE — Plan of Care (Signed)
  Problem: Pain Managment: Goal: General experience of comfort will improve and/or be controlled Outcome: Adequate for Discharge

## 2024-05-09 DIAGNOSIS — F02B0 Dementia in other diseases classified elsewhere, moderate, without behavioral disturbance, psychotic disturbance, mood disturbance, and anxiety: Secondary | ICD-10-CM | POA: Diagnosis not present

## 2024-05-09 DIAGNOSIS — G309 Alzheimer's disease, unspecified: Secondary | ICD-10-CM | POA: Diagnosis not present

## 2024-05-09 DIAGNOSIS — G9341 Metabolic encephalopathy: Secondary | ICD-10-CM | POA: Diagnosis not present

## 2024-05-09 LAB — BASIC METABOLIC PANEL WITH GFR
Anion gap: 10 (ref 5–15)
BUN: 19 mg/dL (ref 8–23)
CO2: 24 mmol/L (ref 22–32)
Calcium: 8.3 mg/dL — ABNORMAL LOW (ref 8.9–10.3)
Chloride: 104 mmol/L (ref 98–111)
Creatinine, Ser: 0.8 mg/dL (ref 0.61–1.24)
GFR, Estimated: >60 mL/min (ref >60)
Glucose, Bld: 104 mg/dL — ABNORMAL HIGH (ref 70–99)
Potassium: 3.3 mmol/L — ABNORMAL LOW (ref 3.5–5.1)
Sodium: 138 mmol/L (ref 135–145)

## 2024-05-09 LAB — URINE CULTURE: Culture: >=100000 — AB

## 2024-05-09 LAB — MAGNESIUM: Magnesium: 2.1 mg/dL (ref 1.7–2.4)

## 2024-05-09 MED ORDER — POTASSIUM CHLORIDE CRYS ER 20 MEQ PO TBCR
40.0000 meq | EXTENDED_RELEASE_TABLET | Freq: Once | ORAL | Status: AC
  Administered 2024-05-09: 40 meq via ORAL
  Filled 2024-05-09: qty 2

## 2024-05-09 MED ORDER — LACTATED RINGERS IV BOLUS
250.0000 mL | Freq: Once | INTRAVENOUS | Status: AC
  Administered 2024-05-09: 250 mL via INTRAVENOUS

## 2024-05-09 NOTE — Plan of Care (Signed)
  Problem: Clinical Measurements: Goal: Will remain free from infection Outcome: Progressing   Problem: Coping: Goal: Level of anxiety will decrease Outcome: Progressing   

## 2024-05-09 NOTE — Progress Notes (Addendum)
 Nurse at bedside,patient confused and disoriented times four.Patient very irritable and aggressive this morning,patient hit my arm twice,wife at the bedside informing patient that was not nice,and that I was there to help him.Emotional support provided.Medications crushed and placed in applesauce,patient was able to take medications. Plan  of care on going.

## 2024-05-09 NOTE — Progress Notes (Signed)
 Nurse at bedside,patient restless,sweating,hard to calm down,patient given Ativan  1 mg IV for anxiety. Plan of care on going.

## 2024-05-09 NOTE — Plan of Care (Signed)
   Problem: Education: Goal: Knowledge of General Education information will improve Description Including pain rating scale, medication(s)/side effects and non-pharmacologic comfort measures Outcome: Progressing   Problem: Education: Goal: Knowledge of General Education information will improve Description Including pain rating scale, medication(s)/side effects and non-pharmacologic comfort measures Outcome: Progressing

## 2024-05-09 NOTE — Progress Notes (Signed)
 Nurse at bedside,patient receiving a Bolus of Lactated Ringers  250 ml's IV per Dr Ferdie orders.Plan of care on going.

## 2024-05-09 NOTE — Progress Notes (Signed)
 Nurse at bedside,patient agitated ,IV was pulled out,catheter intact. New IV had to be restarted 22 gauge to right anterior forearm times two attempts,patient pulling and hitting at me while trying to establish IV, wife at bedside assisting to provide support for patient and talking with him trying to calm him down.Blood pressure 96/64,heart rate 90,Dr Johnson notified.Plan of care on going.Time spent in room 55 minutes.SABRA

## 2024-05-09 NOTE — Progress Notes (Signed)
 PROGRESS NOTE   Christopher Mccoy  FMW:[id number] DOB: 09-06-1946 DOA: 05/06/2024 [facility]: [facility]   Chief Complaint  Patient presents with   Altered Mental Status   Level of care: Med-Surg  Brief Admission History:  78 y.o. male with medical history significant for Alzheimer's dementia with behavioral disturbances, dyslipidemia, chronic diastolic CHF, and recent discharge on 6/29 after admission for acute metabolic encephalopathy with E. coli UTI.  He was brought to the ED due to altered mentation and combativeness over the past few days according to his wife at bedside.  He had his urine rechecked by home health nurse and was noted to have UTI and apparently urine culture had come back positive for E. coli, but he was brought to the ED prior to initiating treatment at home.  He has not been eating or drinking reliably at home and has not been taking his home medications reliably as well.  Placement as well as consultation with palliative was discussed on recent admission, and wife was reminded of this and refuses both placement or palliative consultation at this time.  Urinalysis positive for UTI and patient started on Rocephin  in the ED.  He is severely demented and had agitation.    Assessment and Plan:  E Coli UTI  - urinalysis strongly positive - suspect recurrent E coli infection  - agree with IV ceftriaxone , increase dose to 2 gram as no blood cultures collected to rule out bacteremia - follow urine culture results - >100K E coli   Acute metabolic encephalopathy - precipitated by UTI in the setting of advanced dementia  - continue treatment as noted above - anticipating acute hospital delirium as he had with last recent hospitalization - delirium precautions ordered - wife at bedside as much as able  Advanced Alzheimer's dementia with behavioral disturbances - Continue Aricept , Namenda , and Celexa  - Seroquel  as ordered previously - Ativan  as needed - Encourage  palliative evaluation, likely outpatient   Dyslipidemia - Statin has been discontinued prior to arrival    History of chronic diastolic dysfunction Currently euvolemic and no signs of acute decompensated heart failure   Generalized weakness with ambulatory dysfunction/recurrent falls -Patient's wife refused placement to memory unit/long-term care during last admission and refuses at this time - Fall precautions as he is a high fall risk - Consider PT/OT evaluation as needed  DVT prophylaxis: enoxaparin   Code Status: DNR  Family Communication: wife at bedside Disposition: home with wife tomorrow if stable   Consultants:   Procedures:   Antimicrobials:  Ceftriaxone  7/18>>   Subjective: Pt was resting when I came in and he remained very confused, delirious.   Objective: Vitals:   05/09/24 0428 05/09/24 0941 05/09/24 1326 05/09/24 1522  BP: (!) 115/58 108/69 (!) 130/102 96/64  Pulse: 65 99 63 90  Resp: 16     Temp: 97.7 F (36.5 C) (!) 97.5 F (36.4 C) (!) 97.4 F (36.3 C) 97.7 F (36.5 C)  TempSrc: Oral Axillary Axillary Axillary  SpO2: 98% 99% (!) 89% 93%  Weight:      Height:        Intake/Output Summary (Last 24 hours) at 05/09/2024 1645 Last data filed at 05/09/2024 0431 Gross per 24 hour  Intake 300 ml  Output 500 ml  Net -200 ml   Filed Weights   05/06/24 1444  Weight: 79.8 kg   Examination:  General exam: pt somnolent with intermittent agitation and delirious.   Respiratory system: Clear to auscultation. Respiratory effort normal. Cardiovascular  system: normal S1 & S2 heard. No JVD, murmurs, rubs, gallops or clicks. No pedal edema. Gastrointestinal system: Abdomen is nondistended, soft and nontender. No organomegaly or masses felt. Normal bowel sounds heard. Central nervous system: somnolent but arousable. No focal neurological deficits. Extremities: Symmetric 5 x 5 power. Skin: No rashes, lesions or ulcers. Psychiatry: Judgement and insight appear  severely diminished.  Data Reviewed: I have personally reviewed following labs and imaging studies  CBC: Recent Labs  Lab 05/06/24 1445 05/07/24 0356  WBC 6.8 6.1  NEUTROABS 3.7  --   HGB 14.7 13.8  HCT 43.4 41.7  MCV 100.0 102.5*  PLT 364 316    Basic Metabolic Panel: Recent Labs  Lab 05/06/24 1445 05/07/24 0356 05/08/24 0403 05/09/24 0408  NA 138 141 140 138  K 3.9 4.3 3.2* 3.3*  CL 103 107 107 104  CO2 22 25 25 24   GLUCOSE 141* 97 98 104*  BUN 20 20 22 19   CREATININE 0.88 0.84 0.84 0.80  CALCIUM  8.8* 8.6* 8.3* 8.3*  MG  --  2.1  --  2.1    CBG: No results for input(s): GLUCAP in the last 168 hours.  Recent Results (from the past 240 hours)  Resp panel by RT-PCR (RSV, Flu A&B, Covid) Anterior Nasal Swab     Status: None   Collection Time: 05/06/24  2:45 PM   Specimen: Anterior Nasal Swab  Result Value Ref Range Status   SARS Coronavirus 2 by RT PCR NEGATIVE NEGATIVE Final    Comment: (NOTE) SARS-CoV-2 target nucleic acids are NOT DETECTED.  The SARS-CoV-2 RNA is generally detectable in upper respiratory specimens during the acute phase of infection. The lowest concentration of SARS-CoV-2 viral copies this assay can detect is 138 copies/mL. A negative result does not preclude SARS-Cov-2 infection and should not be used as the sole basis for treatment or other patient management decisions. A negative result may occur with  improper specimen collection/handling, submission of specimen other than nasopharyngeal swab, presence of viral mutation(s) within the areas targeted by this assay, and inadequate number of viral copies(<138 copies/mL). A negative result must be combined with clinical observations, patient history, and epidemiological information. The expected result is Negative.  Fact Sheet for Patients:  BloggerCourse.com  Fact Sheet for Healthcare Providers:  SeriousBroker.it  This test is no t  yet approved or cleared by the [place]  FDA and  has been authorized for detection and/or diagnosis of SARS-CoV-2 by FDA under an Emergency Use Authorization (EUA). This EUA will remain  in effect (meaning this test can be used) for the duration of the COVID-19 declaration under Section 564(b)(1) of the Act, 21 U.S.C.section 360bbb-3(b)(1), unless the authorization is terminated  or revoked sooner.       Influenza A by PCR NEGATIVE NEGATIVE Final   Influenza B by PCR NEGATIVE NEGATIVE Final    Comment: (NOTE) The Xpert Xpress SARS-CoV-2/FLU/RSV plus assay is intended as an aid in the diagnosis of influenza from Nasopharyngeal swab specimens and should not be used as a sole basis for treatment. Nasal washings and aspirates are unacceptable for Xpert Xpress SARS-CoV-2/FLU/RSV testing.  Fact Sheet for Patients: BloggerCourse.com  Fact Sheet for Healthcare Providers: SeriousBroker.it  This test is not yet approved or cleared by the [place]  FDA and has been authorized for detection and/or diagnosis of SARS-CoV-2 by FDA under an Emergency Use Authorization (EUA). This EUA will remain in effect (meaning this test can be used) for the duration of the COVID-19 declaration  under Section 564(b)(1) of the Act, 21 U.S.C. section 360bbb-3(b)(1), unless the authorization is terminated or revoked.     Resp Syncytial Virus by PCR NEGATIVE NEGATIVE Final    Comment: (NOTE) Fact Sheet for Patients: BloggerCourse.com  Fact Sheet for Healthcare Providers: SeriousBroker.it  This test is not yet approved or cleared by the [place]  FDA and has been authorized for detection and/or diagnosis of SARS-CoV-2 by FDA under an Emergency Use Authorization (EUA). This EUA will remain in effect (meaning this test can be used) for the duration of the COVID-19 declaration under Section 564(b)(1)  of the Act, 21 U.S.C. section 360bbb-3(b)(1), unless the authorization is terminated or revoked.  Performed at [facility], [address], [place], [place] [address]   Urine Culture     Status: Abnormal   Collection Time: 05/06/24  2:45 PM   Specimen: Urine, Clean Catch  Result Value Ref Range Status   Specimen Description   Final    URINE, CLEAN CATCH Performed at [facility], [address], [place], [place] [address]    Special Requests   Final    NONE Performed at [facility], [address] [place], [place] [address]    Culture >=100,000 COLONIES/mL ESCHERICHIA COLI (A)  Final   Report Status 05/09/2024 FINAL  Final   Organism ID, Bacteria ESCHERICHIA COLI (A)  Final      Susceptibility   Escherichia coli - MIC*    AMPICILLIN >=32 RESISTANT Resistant     CEFAZOLIN <=4 SENSITIVE Sensitive     CEFEPIME <=0.12 SENSITIVE Sensitive     CEFTRIAXONE  <=0.25 SENSITIVE Sensitive     CIPROFLOXACIN <=0.25 SENSITIVE Sensitive     GENTAMICIN <=1 SENSITIVE Sensitive     IMIPENEM <=0.25 SENSITIVE Sensitive     NITROFURANTOIN <=16 SENSITIVE Sensitive     TRIMETH/SULFA <=20 SENSITIVE Sensitive     AMPICILLIN/SULBACTAM >=32 RESISTANT Resistant     PIP/TAZO <=4 SENSITIVE Sensitive ug/mL    * >=100,000 COLONIES/mL ESCHERICHIA COLI     Radiology Studies: No results found.  Scheduled Meds:  citalopram   20 mg Oral Daily   cyanocobalamin   1,000 mcg Oral Daily   donepezil   10 mg Oral QHS   enoxaparin  (LOVENOX ) injection  40 mg Subcutaneous Q24H   memantine   10 mg Oral BID   QUEtiapine   150 mg Oral QHS   QUEtiapine   50 mg Oral q morning   Continuous Infusions:  cefTRIAXone  (ROCEPHIN )  IV 2 g (05/09/24 1553)   lactated ringers       LOS: 2 days   Time spent: 47 mins  Kenneith Stief Vicci, MD How to contact the [facility] Attending or Consulting provider 7A - 7P or covering provider during after hours 7P -7A, for this patient?  Check the care team in [facility] and look for a) attending/consulting  TRH provider listed and b) the TRH team listed Log into [email or web address] to find provider on call.  Locate the TRH provider you are looking for under [facility] and page to a number that you can be directly reached. If you still have difficulty reaching the provider, please page the [facility] (Director on Call) for the Hospitalists listed on amion for assistance.  05/09/2024, 4:45 PM

## 2024-05-09 NOTE — Progress Notes (Signed)
   05/09/24 1522  Vitals  Temp 97.7 F (36.5 C)  Temp Source Axillary  BP 96/64  MAP (mmHg) 75  BP Location Right Leg  BP Method Automatic  Patient Position (if appropriate) Lying  Pulse Rate 90  Pulse Rate Source Dinamap  MEWS COLOR  MEWS Score Color Yellow  Oxygen Therapy  SpO2 93 %  O2 Device Room Air  MEWS Score  MEWS Temp 0  MEWS Systolic 1  MEWS Pulse 0  MEWS RR 0  MEWS LOC 1  MEWS Score 2  Provider Notification  Provider Name/Title Dr Vicci  Date Provider Notified 05/09/24  Time Provider Notified 1545  Method of Notification Page  Notification Reason Other (Comment) (b/p 96/64,heart rate 90)  Provider response No new orders  Date of Provider Response 05/09/24

## 2024-05-10 DIAGNOSIS — A419 Sepsis, unspecified organism: Secondary | ICD-10-CM

## 2024-05-10 DIAGNOSIS — N390 Urinary tract infection, site not specified: Secondary | ICD-10-CM | POA: Diagnosis not present

## 2024-05-10 DIAGNOSIS — G9341 Metabolic encephalopathy: Secondary | ICD-10-CM | POA: Diagnosis not present

## 2024-05-10 DIAGNOSIS — G309 Alzheimer's disease, unspecified: Secondary | ICD-10-CM | POA: Diagnosis not present

## 2024-05-10 MED ORDER — CEPHALEXIN 500 MG PO CAPS: 500.0000 mg | ORAL_CAPSULE | Freq: Two times a day (BID) | ORAL | 0 refills | Status: AC

## 2024-05-10 MED ORDER — QUETIAPINE FUMARATE 50 MG PO TABS: 50.0000 mg | ORAL_TABLET | ORAL | Status: DC

## 2024-05-10 MED ORDER — LORAZEPAM 0.5 MG PO TABS: 0.5000 mg | ORAL_TABLET | Freq: Four times a day (QID) | ORAL | 0 refills | Status: DC | PRN

## 2024-05-10 MED ORDER — SENNOSIDES-DOCUSATE SODIUM 8.6-50 MG PO TABS: 2.0000 | ORAL_TABLET | Freq: Every evening | ORAL | Status: DC | PRN

## 2024-05-10 NOTE — Progress Notes (Signed)
 Patient discharged home, with instructions given on medications,and follow up visits ,Mrs Ghosh the wife verbalized understanding.IV discontinued,catheter intact.Prescriptions sent to Pharmacy of choice documented on AVS. [facility] to transport patient home.

## 2024-05-10 NOTE — Discharge Instructions (Signed)
IMPORTANT INFORMATION: PAY CLOSE ATTENTION  ° °PHYSICIAN DISCHARGE INSTRUCTIONS ° °Follow with Primary care provider  [facility]  and other consultants as instructed by your Hospitalist Physician ° °SEEK MEDICAL CARE OR RETURN TO EMERGENCY ROOM IF SYMPTOMS COME BACK, WORSEN OR NEW PROBLEM DEVELOPS  ° °Please note: °You were cared for by a hospitalist during your hospital stay. Every effort will be made to forward records to your primary care provider.  You can request that your primary care provider send for your hospital records if they have not received them.  Once you are discharged, your primary care physician will handle any further medical issues. Please note that NO REFILLS for any discharge medications will be authorized once you are discharged, as it is imperative that you return to your primary care physician (or establish a relationship with a primary care physician if you do not have one) for your post hospital discharge needs so that they can reassess your need for medications and monitor your lab values. ° °Please get a complete blood count and chemistry panel checked by your Primary MD at your next visit, and again as instructed by your Primary MD. ° °Get Medicines reviewed and adjusted: °Please take all your medications with you for your next visit with your Primary MD ° °Laboratory/radiological data: °Please request your Primary MD to go over all hospital tests and procedure/radiological results at the follow up, please ask your primary care provider to get all Hospital records sent to his/her office. ° °In some cases, they will be blood work, cultures and biopsy results pending at the time of your discharge. Please request that your primary care provider follow up on these results. ° °If you are diabetic, please bring your blood sugar readings with you to your follow up appointment with primary care.   ° °Please call and make your follow up appointments as soon as possible.   ° °Also  Note the following: °If you experience worsening of your admission symptoms, develop shortness of breath, life threatening emergency, suicidal or homicidal thoughts you must seek medical attention immediately by calling 911 or calling your MD immediately  if symptoms less severe. ° °You must read complete instructions/literature along with all the possible adverse reactions/side effects for all the Medicines you take and that have been prescribed to you. Take any new Medicines after you have completely understood and accpet all the possible adverse reactions/side effects.  ° °Do not drive when taking Pain medications or sleeping medications (Benzodiazepines) ° °Do not take more than prescribed Pain, Sleep and Anxiety Medications. It is not advisable to combine anxiety,sleep and pain medications without talking with your primary care practitioner ° °Special Instructions: If you have smoked or chewed Tobacco  in the last 2 yrs please stop smoking, stop any regular Alcohol  and or any Recreational drug use. ° °Wear Seat belts while driving.  Do not drive if taking any narcotic, mind altering or controlled substances or recreational drugs or alcohol.  ° ° ° ° ° ° °

## 2024-05-10 NOTE — Progress Notes (Signed)
 During rounding, patient observed in bed restless, trying to get out of bed, agitated, not able to reorient due to patient's confusion. not following commands.  Ativan  IV given. Bed alarm active, call light within reach. Will continue plan of care.

## 2024-05-10 NOTE — Plan of Care (Signed)
   Problem: Elimination: Goal: Will not experience complications related to bowel motility Outcome: Progressing   Problem: Pain Managment: Goal: General experience of comfort will improve and/or be controlled Outcome: Progressing   Problem: Safety: Goal: Ability to remain free from injury will improve Outcome: Progressing

## 2024-05-10 NOTE — Discharge Summary (Signed)
 Physician Discharge Summary  Christopher Mccoy FMW:[id number] DOB: 1946-04-18 DOA: 05/06/2024  PCP: [facility]  Admit date: 05/06/2024 Discharge date: 05/10/2024  Admitted From:  HOME  Disposition: HOME   Recommendations for Outpatient Follow-up:  Follow up with PCP in 1 weeks Please obtain BMP/CBC in 1 week  Home Health: Declined   Discharge Condition: STABLE   CODE STATUS: DNR DIET: finger foods, encourage fluids   Brief Hospitalization Summary: Please see all hospital notes, images, labs for full details of the hospitalization. Admission provider HPI:  78 y.o. male with medical history significant for Alzheimer's dementia with behavioral disturbances, dyslipidemia, chronic diastolic CHF, and recent discharge on 6/29 after admission for acute metabolic encephalopathy with E. coli UTI.  He was brought to the ED due to altered mentation and combativeness over the past few days according to his wife at bedside.  He had his urine rechecked by home health nurse and was noted to have UTI and apparently urine culture had come back positive for E. coli, but he was brought to the ED prior to initiating treatment at home.  He has not been eating or drinking reliably at home and has not been taking his home medications reliably as well.  Placement as well as consultation with palliative was discussed on recent admission, and wife was reminded of this and refuses both placement or palliative consultation at this time.  Urinalysis positive for UTI and patient started on Rocephin  in the ED.  He is severely demented and had agitation.   Hospital Course by listed problems addressed  E Coli UTI  - urinalysis strongly positive - suspect recurrent E coli infection  - agree with IV ceftriaxone , increase dose to 2 gram as no blood cultures collected to rule out bacteremia - follow urine culture results - >100K E coli  - DC home today on oral cephalexin  x 4 more days to complete course    Acute  metabolic encephalopathy - precipitated by UTI in the setting of advanced dementia and acute delirium  - he is improved to baseline with treatment of UTI  - delirium precautions ordered while in hospital  - wife at bedside as much as able   Advanced Alzheimer's dementia with behavioral disturbances - Continue Namenda , Celexa  - Seroquel  as ordered previously - Ativan  as needed - Encourage palliative evaluation, likely outpatient, but wife declines - wife declines home health support, says she has everything she needs at home   Dyslipidemia - Statin has been discontinued prior to arrival    History of chronic diastolic dysfunction Currently euvolemic and no signs of acute decompensated heart failure   Generalized weakness with ambulatory dysfunction/recurrent falls - Patient's wife refused placement to memory unit/long-term care during last admission and refuses at this time - Fall precautions as he is a high fall risk  Discharge Diagnoses:  Principal Problem:   Acute metabolic encephalopathy Active Problems:   Dementia (HCC)   UTI (urinary tract infection)   Hyperlipidemia   Sepsis secondary to UTI [name])   Discharge Instructions:  Allergies as of 05/10/2024       Reactions   Penicillins Anaphylaxis   Haldol  [haloperidol ] Itching, Other (See Comments)   Aggressive, confused, irritated, very altered        Medication List     TAKE these medications    acetaminophen  325 MG tablet Commonly known as: TYLENOL  Take 2 tablets (650 mg total) by mouth every 6 (six) hours as needed for mild pain (pain score 1-3) or  fever (or Fever >/= 101).   cephALEXin  500 MG capsule Commonly known as: KEFLEX  Take 1 capsule (500 mg total) by mouth 2 (two) times daily for 4 days.   citalopram  20 MG tablet Commonly known as: CeleXA  Take 1 tablet (20 mg total) by mouth daily.   cyanocobalamin  1000 MCG tablet Take 1,000 mcg by mouth daily.   LORazepam  0.5 MG tablet Commonly known as:  ATIVAN  Take 1-2 tablets (0.5-1 mg total) by mouth every 6 (six) hours as needed for anxiety (severe agitation).   memantine  10 MG tablet Commonly known as: NAMENDA  Take 1 tablet (10 mg total) by mouth 2 (two) times daily.   QUEtiapine  50 MG tablet Commonly known as: SEROQUEL  Take 1 tablet (50 mg total) by mouth See admin instructions. Take 150 mg at bedtime and 50 mg in the morning   senna-docusate 8.6-50 MG tablet Commonly known as: Senokot-S Take 2 tablets by mouth at bedtime as needed for mild constipation.        Follow-up Information     [facility].   Why: Hospital Follow Up Contact information: [address] [place] [address] [phone]                Allergies  Allergen Reactions   Penicillins Anaphylaxis   Haldol  [Haloperidol ] Itching and Other (See Comments)    Aggressive, confused, irritated, very altered   Allergies as of 05/10/2024       Reactions   Penicillins Anaphylaxis   Haldol  [haloperidol ] Itching, Other (See Comments)   Aggressive, confused, irritated, very altered        Medication List     TAKE these medications    acetaminophen  325 MG tablet Commonly known as: TYLENOL  Take 2 tablets (650 mg total) by mouth every 6 (six) hours as needed for mild pain (pain score 1-3) or fever (or Fever >/= 101).   cephALEXin  500 MG capsule Commonly known as: KEFLEX  Take 1 capsule (500 mg total) by mouth 2 (two) times daily for 4 days.   citalopram  20 MG tablet Commonly known as: CeleXA  Take 1 tablet (20 mg total) by mouth daily.   cyanocobalamin  1000 MCG tablet Take 1,000 mcg by mouth daily.   LORazepam  0.5 MG tablet Commonly known as: ATIVAN  Take 1-2 tablets (0.5-1 mg total) by mouth every 6 (six) hours as needed for anxiety (severe agitation).   memantine  10 MG tablet Commonly known as: NAMENDA  Take 1 tablet (10 mg total) by mouth 2 (two) times daily.   QUEtiapine  50 MG tablet Commonly known as:  SEROQUEL  Take 1 tablet (50 mg total) by mouth See admin instructions. Take 150 mg at bedtime and 50 mg in the morning   senna-docusate 8.6-50 MG tablet Commonly known as: Senokot-S Take 2 tablets by mouth at bedtime as needed for mild constipation.        Procedures/Studies: CT Head Wo Contrast Result Date: 05/06/2024 CLINICAL DATA:  Delirium EXAM: CT HEAD WITHOUT CONTRAST TECHNIQUE: Contiguous axial images were obtained from the base of the skull through the vertex without intravenous contrast. RADIATION DOSE REDUCTION: This exam was performed according to the departmental dose-optimization program which includes automated exposure control, adjustment of the mA and/or kV according to patient size and/or use of iterative reconstruction technique. COMPARISON:  April 14, 2024 FINDINGS: Brain: Proportional prominence of the ventricles and sulci, consistent with diffuse cerebral parenchymal volume loss. The ventricles otherwise maintained midline position without midline shift. Gray-white differentiation is preserved.Periventricular and subcortical white matter  hypoattenuation, most consistent with changes of moderate chronic ischemic microvascular disease.No evidence of acute territorial infarction, extra-axial fluid collection, hemorrhage, or mass lesion. The basilar cisterns are patent without downward herniation. The cerebellar hemispheres and vermis are well formed without mass lesion or focal attenuation abnormality. Vascular: No hyperdense vessel. Calcified atherosclerotic plaque within the cavernous/supraclinoid ICA and intradural vertebral arteries. Skull: Normal. Negative for fracture or focal lesion. Sinuses/Orbits: The paranasal sinuses and mastoids are clear.Similar proptosis of both globes, which are otherwise intact. No retrobulbar hematoma.Large volume of cerumen in the external auditory canals of both is. Other: None. IMPRESSION: 1. No acute intracranial abnormality, specifically, no acute  hemorrhage, territorial infarction, or intracranial mass. 2. Global cerebral volume loss with sequelae of chronic ischemic microvascular disease. Electronically Signed   By: Rogelia Myers M.D.   On: 05/06/2024 16:31   CT HEAD WO CONTRAST ( ) Result Date: 04/14/2024 CLINICAL DATA:  Clemens and hit head last week, with increasing confusion since then. Previous history of Alzheimer's dementia. Caretakers report darkening urine. Question UTI. The patient has been guarding h the at the is abdomen which the caretakers interpreted is abdominal pain. EXAM: CT HEAD WITHOUT CONTRAST CT ABDOMEN AND PELVIS WITHOUT CONTRAST TECHNIQUE: Contiguous axial images were obtained from the base of the skull through the vertex without intravenous contrast. Multiplanar CT image reconstructions were also generated. Multidetector CT imaging of the abdomen and pelvis was performed following the standard protocol without IV contrast. RADIATION DOSE REDUCTION: This exam was performed according to the departmental dose-optimization program which includes automated exposure control, adjustment of the mA and/or kV according to patient size and/or use of iterative reconstruction technique. COMPARISON:  Head CT recently 04/11/2024, head CT report with images unavailable from 11/29/2022. No prior abdominal films or imaging. FINDINGS: CT HEAD FINDINGS Brain: There is mild-to-moderate cerebral atrophy with atrophic ventriculomegaly and mild small vessel disease of the cerebral white matter. The cerebellum and brainstem are unremarkable. No cortical based acute infarct, hemorrhage, mass or mass effect are seen. There is no midline shift.  The basal cisterns are clear. Vascular: There are calcific plaques in both siphons. No hyperdense central vasculature is seen. Skull: Negative for depressed fractures or focal lesions. No visible scalp hematoma. Sinuses/Orbits: Bilateral symmetric proptosis with otherwise negative orbits. There is patchy membrane  thickening in the ethmoids. Other paranasal sinuses are clear. Mild fluid in the right mastoid tip is again noted with the mastoid air cells otherwise clear. Nasal septum deviates left. There are bilateral wax impactions in the external auditory canals. Other: None. CT ABDOMEN AND PELVIS FINDINGS Technical note: Patient was unwilling to lie flat for the study. Positioning for this exam is right lateral decubitus. Lower chest: There is marked elevation of the right hemidiaphragm consistent with eventration or paresis, with overlying atelectasis. Lung bases are otherwise clear. There is mild bronchial thickening in the lower lobes. The cardiac size is normal. Coronary arteries are heavily calcified. Hepatobiliary: No focal liver abnormality is seen without contrast. No gallstones, gallbladder wall thickening, or biliary dilatation. Pancreas: No abnormality is seen without contrast. Spleen: No abnormality is seen without contrast. Adrenals/Urinary Tract: There is no adrenal mass, no contour deforming abnormality of the unenhanced kidneys. Symmetric perinephric stranding change, typically chronic but without old studies chronicity is indeterminate and possible this could be inflammatory or from pyelonephritis. There is a 1 mm nonobstructive caliceal stone in the superior pole right kidney, 2 mm caliceal stone in the superior pole of the left. No other nephrolithiasis is seen.  There is no hydronephrosis or ureteral stone. The bladder is thickened with perivesical stranding concerning for cystitis. Some of the thickening could in part be due to chronic bladder outlet obstruction as the prostate is significantly enlarged measuring 6.1 cm. Stomach/Bowel: Unremarkable contracted stomach and unopacified small bowel. An appendix is not seen. There is mild fecal stasis. There is colonic interposition of the hepatic flexure between the liver and abdominal wall. Uncomplicated sigmoid diverticulosis. No evidence of acute colitis  or diverticulitis. Vascular/Lymphatic: Aortic atherosclerosis. No enlarged abdominal or pelvic lymph nodes. Reproductive: Enlarged prostate, dystrophic calcifications centrally. As above measures 6.1 cm transverse axis. Both testicles are in the scrotal sac. Other: There is a small left inguinal fat hernia. No incarcerated hernia. Trace presacral ascites. No free hemorrhage, free air or abscess. Musculoskeletal: Mild levoscoliosis and degenerative changes of the spine. Osteopenia. Acquired spinal stenosis L4-5. IMPRESSION: 1. No acute intracranial CT findings or depressed skull fractures. 2. Atrophy and small-vessel disease. 3. Bilateral proptosis. 4. Mild ethmoid sinus membrane thickening. 5. Bilateral wax impactions in the external auditory canals. 6. Marked elevation of the right hemidiaphragm consistent with eventration or paresis, with overlying atelectasis. 7. Aortic and coronary artery atherosclerosis. 8. Constipation and diverticulosis. 9. Enlarged prostate with bladder thickening and perivesical stranding concerning for cystitis. There could also be a chronic bladder outlet problem. 10. Nonobstructive nephrolithiasis. 11. Trace presacral ascites. 12. Osteopenia and degenerative change. Acquired spinal stenosis L4-5. Aortic Atherosclerosis (ICD10-I70.0). Electronically Signed   By: Francis Quam M.D.   On: 04/14/2024 01:11   CT ABDOMEN PELVIS WO CONTRAST Result Date: 04/14/2024 CLINICAL DATA:  Clemens and hit head last week, with increasing confusion since then. Previous history of Alzheimer's dementia. Caretakers report darkening urine. Question UTI. The patient has been guarding h the at the is abdomen which the caretakers interpreted is abdominal pain. EXAM: CT HEAD WITHOUT CONTRAST CT ABDOMEN AND PELVIS WITHOUT CONTRAST TECHNIQUE: Contiguous axial images were obtained from the base of the skull through the vertex without intravenous contrast. Multiplanar CT image reconstructions were also generated.  Multidetector CT imaging of the abdomen and pelvis was performed following the standard protocol without IV contrast. RADIATION DOSE REDUCTION: This exam was performed according to the departmental dose-optimization program which includes automated exposure control, adjustment of the mA and/or kV according to patient size and/or use of iterative reconstruction technique. COMPARISON:  Head CT recently 04/11/2024, head CT report with images unavailable from 11/29/2022. No prior abdominal films or imaging. FINDINGS: CT HEAD FINDINGS Brain: There is mild-to-moderate cerebral atrophy with atrophic ventriculomegaly and mild small vessel disease of the cerebral white matter. The cerebellum and brainstem are unremarkable. No cortical based acute infarct, hemorrhage, mass or mass effect are seen. There is no midline shift.  The basal cisterns are clear. Vascular: There are calcific plaques in both siphons. No hyperdense central vasculature is seen. Skull: Negative for depressed fractures or focal lesions. No visible scalp hematoma. Sinuses/Orbits: Bilateral symmetric proptosis with otherwise negative orbits. There is patchy membrane thickening in the ethmoids. Other paranasal sinuses are clear. Mild fluid in the right mastoid tip is again noted with the mastoid air cells otherwise clear. Nasal septum deviates left. There are bilateral wax impactions in the external auditory canals. Other: None. CT ABDOMEN AND PELVIS FINDINGS Technical note: Patient was unwilling to lie flat for the study. Positioning for this exam is right lateral decubitus. Lower chest: There is marked elevation of the right hemidiaphragm consistent with eventration or paresis, with overlying atelectasis. Lung bases are  otherwise clear. There is mild bronchial thickening in the lower lobes. The cardiac size is normal. Coronary arteries are heavily calcified. Hepatobiliary: No focal liver abnormality is seen without contrast. No gallstones, gallbladder wall  thickening, or biliary dilatation. Pancreas: No abnormality is seen without contrast. Spleen: No abnormality is seen without contrast. Adrenals/Urinary Tract: There is no adrenal mass, no contour deforming abnormality of the unenhanced kidneys. Symmetric perinephric stranding change, typically chronic but without old studies chronicity is indeterminate and possible this could be inflammatory or from pyelonephritis. There is a 1 mm nonobstructive caliceal stone in the superior pole right kidney, 2 mm caliceal stone in the superior pole of the left. No other nephrolithiasis is seen. There is no hydronephrosis or ureteral stone. The bladder is thickened with perivesical stranding concerning for cystitis. Some of the thickening could in part be due to chronic bladder outlet obstruction as the prostate is significantly enlarged measuring 6.1 cm. Stomach/Bowel: Unremarkable contracted stomach and unopacified small bowel. An appendix is not seen. There is mild fecal stasis. There is colonic interposition of the hepatic flexure between the liver and abdominal wall. Uncomplicated sigmoid diverticulosis. No evidence of acute colitis or diverticulitis. Vascular/Lymphatic: Aortic atherosclerosis. No enlarged abdominal or pelvic lymph nodes. Reproductive: Enlarged prostate, dystrophic calcifications centrally. As above measures 6.1 cm transverse axis. Both testicles are in the scrotal sac. Other: There is a small left inguinal fat hernia. No incarcerated hernia. Trace presacral ascites. No free hemorrhage, free air or abscess. Musculoskeletal: Mild levoscoliosis and degenerative changes of the spine. Osteopenia. Acquired spinal stenosis L4-5. IMPRESSION: 1. No acute intracranial CT findings or depressed skull fractures. 2. Atrophy and small-vessel disease. 3. Bilateral proptosis. 4. Mild ethmoid sinus membrane thickening. 5. Bilateral wax impactions in the external auditory canals. 6. Marked elevation of the right hemidiaphragm  consistent with eventration or paresis, with overlying atelectasis. 7. Aortic and coronary artery atherosclerosis. 8. Constipation and diverticulosis. 9. Enlarged prostate with bladder thickening and perivesical stranding concerning for cystitis. There could also be a chronic bladder outlet problem. 10. Nonobstructive nephrolithiasis. 11. Trace presacral ascites. 12. Osteopenia and degenerative change. Acquired spinal stenosis L4-5. Aortic Atherosclerosis (ICD10-I70.0). Electronically Signed   By: Francis Quam M.D.   On: 04/14/2024 01:11   DG Chest Port 1 View Result Date: 04/13/2024 CLINICAL DATA:  Altered mental status. EXAM: PORTABLE CHEST 1 VIEW COMPARISON:  Portable chest 04/11/2024. FINDINGS: The cardiac size is normal. No vascular congestion is seen. Mediastinum is normally outlined. There is calcification in the transverse aorta. Low lung volumes. Limited view of lung bases especially on the right where the diaphragm is again noted elevated. The visualized lungs are clear of infiltrates. Right perihilar linear atelectasis is again shown. No new or acute osseous findings. IMPRESSION: 1. Low lung volumes with right perihilar linear atelectasis. No evidence of acute chest disease as far as seen. 2. Aortic atherosclerosis. Electronically Signed   By: Francis Quam M.D.   On: 04/13/2024 23:32   DG Chest Portable 1 View Result Date: 04/11/2024 CLINICAL DATA:  Recurrent falls EXAM: PORTABLE CHEST 1 VIEW COMPARISON:  09/11/2021 FINDINGS: Heart and mediastinal contours are within normal limits. Low lung volumes. Elevation of the right hemidiaphragm with right base atelectasis. No confluent opacity on the left. No effusions or pneumothorax. No acute bony abnormality. IMPRESSION: Low lung volumes. Stable chronic elevation of the right hemidiaphragm with right base atelectasis. Electronically Signed   By: Franky Crease M.D.   On: 04/11/2024 22:35   CT Head Wo Contrast Result  Date: 04/11/2024 CLINICAL DATA:   Recent fall with headaches and neck pain, initial encounter EXAM: CT HEAD WITHOUT CONTRAST CT CERVICAL SPINE WITHOUT CONTRAST TECHNIQUE: Multidetector CT imaging of the head and cervical spine was performed following the standard protocol without intravenous contrast. Multiplanar CT image reconstructions of the cervical spine were also generated. RADIATION DOSE REDUCTION: This exam was performed according to the departmental dose-optimization program which includes automated exposure control, adjustment of the mA and/or kV according to patient size and/or use of iterative reconstruction technique. COMPARISON:  None Available. FINDINGS: CT HEAD FINDINGS Brain: No evidence of acute infarction, hemorrhage, hydrocephalus, extra-axial collection or mass lesion/mass effect. Vascular: No hyperdense vessel or unexpected calcification. Skull: Normal. Negative for fracture or focal lesion. Sinuses/Orbits: No acute finding. Other: Small scalp hematoma is noted in the right frontal region. CT CERVICAL SPINE FINDINGS Alignment: Mild straightening of the normal cervical lordosis is noted. Skull base and vertebrae: 7 cervical segments are well visualized. Mild osteophytic changes and facet hypertrophic changes are noted. No acute fracture or acute facet abnormality is noted. The odontoid is within normal limits. Soft tissues and spinal canal: Surrounding soft tissue structures are within normal limits. Upper chest: Visualized lung apices are unremarkable. Other: None IMPRESSION: CT of the head: Mild scalp hematoma on the right. No acute intracranial abnormality noted. CT of the cervical spine: Mild degenerative changes without acute abnormality. Electronically Signed   By: Oneil Devonshire M.D.   On: 04/11/2024 22:01   CT Cervical Spine Wo Contrast Result Date: 04/11/2024 CLINICAL DATA:  Recent fall with headaches and neck pain, initial encounter EXAM: CT HEAD WITHOUT CONTRAST CT CERVICAL SPINE WITHOUT CONTRAST TECHNIQUE:  Multidetector CT imaging of the head and cervical spine was performed following the standard protocol without intravenous contrast. Multiplanar CT image reconstructions of the cervical spine were also generated. RADIATION DOSE REDUCTION: This exam was performed according to the departmental dose-optimization program which includes automated exposure control, adjustment of the mA and/or kV according to patient size and/or use of iterative reconstruction technique. COMPARISON:  None Available. FINDINGS: CT HEAD FINDINGS Brain: No evidence of acute infarction, hemorrhage, hydrocephalus, extra-axial collection or mass lesion/mass effect. Vascular: No hyperdense vessel or unexpected calcification. Skull: Normal. Negative for fracture or focal lesion. Sinuses/Orbits: No acute finding. Other: Small scalp hematoma is noted in the right frontal region. CT CERVICAL SPINE FINDINGS Alignment: Mild straightening of the normal cervical lordosis is noted. Skull base and vertebrae: 7 cervical segments are well visualized. Mild osteophytic changes and facet hypertrophic changes are noted. No acute fracture or acute facet abnormality is noted. The odontoid is within normal limits. Soft tissues and spinal canal: Surrounding soft tissue structures are within normal limits. Upper chest: Visualized lung apices are unremarkable. Other: None IMPRESSION: CT of the head: Mild scalp hematoma on the right. No acute intracranial abnormality noted. CT of the cervical spine: Mild degenerative changes without acute abnormality. Electronically Signed   By: Oneil Devonshire M.D.   On: 04/11/2024 22:01     Subjective: Pt with severe dementia unable to make needs known.   Discharge Exam: Vitals:   05/10/24 0432 05/10/24 0948  BP: (!) 140/90 (!) 124/96  Pulse: 67 89  Resp: 16 18  Temp: 97.6 F (36.4 C) 98 F (36.7 C)  SpO2: 100% 100%   Vitals:   05/09/24 2018 05/10/24 0039 05/10/24 0432 05/10/24 0948  BP: (!) 158/86 (!) 118/98 (!) 140/90  (!) 124/96  Pulse: 96 92 67 89  Resp: 20 20 16  18  Temp: 98 F (36.7 C) 98.6 F (37 C) 97.6 F (36.4 C) 98 F (36.7 C)  TempSrc: Axillary Axillary Axillary Oral  SpO2: 95% 98% 100% 100%  Weight:      Height:       General: Pt is alert, awake, not in acute distress, with severe dementia, delirium Cardiovascular: normal S1/S2 +, no rubs, no gallops Respiratory: CTA bilaterally, no wheezing, no rhonchi Abdominal: Soft, NT, ND, bowel sounds + Extremities: no edema, no cyanosis   The results of significant diagnostics from this hospitalization (including imaging, microbiology, ancillary and laboratory) are listed below for reference.     Microbiology: Recent Results (from the past 240 hours)  Resp panel by RT-PCR (RSV, Flu A&B, Covid) Anterior Nasal Swab     Status: None   Collection Time: 05/06/24  2:45 PM   Specimen: Anterior Nasal Swab  Result Value Ref Range Status   SARS Coronavirus 2 by RT PCR NEGATIVE NEGATIVE Final    Comment: (NOTE) SARS-CoV-2 target nucleic acids are NOT DETECTED.  The SARS-CoV-2 RNA is generally detectable in upper respiratory specimens during the acute phase of infection. The lowest concentration of SARS-CoV-2 viral copies this assay can detect is 138 copies/mL. A negative result does not preclude SARS-Cov-2 infection and should not be used as the sole basis for treatment or other patient management decisions. A negative result may occur with  improper specimen collection/handling, submission of specimen other than nasopharyngeal swab, presence of viral mutation(s) within the areas targeted by this assay, and inadequate number of viral copies(<138 copies/mL). A negative result must be combined with clinical observations, patient history, and epidemiological information. The expected result is Negative.  Fact Sheet for Patients:  BloggerCourse.com  Fact Sheet for Healthcare Providers:   SeriousBroker.it  This test is no t yet approved or cleared by the [place]  FDA and  has been authorized for detection and/or diagnosis of SARS-CoV-2 by FDA under an Emergency Use Authorization (EUA). This EUA will remain  in effect (meaning this test can be used) for the duration of the COVID-19 declaration under Section 564(b)(1) of the Act, 21 U.S.C.section 360bbb-3(b)(1), unless the authorization is terminated  or revoked sooner.       Influenza A by PCR NEGATIVE NEGATIVE Final   Influenza B by PCR NEGATIVE NEGATIVE Final    Comment: (NOTE) The Xpert Xpress SARS-CoV-2/FLU/RSV plus assay is intended as an aid in the diagnosis of influenza from Nasopharyngeal swab specimens and should not be used as a sole basis for treatment. Nasal washings and aspirates are unacceptable for Xpert Xpress SARS-CoV-2/FLU/RSV testing.  Fact Sheet for Patients: BloggerCourse.com  Fact Sheet for Healthcare Providers: SeriousBroker.it  This test is not yet approved or cleared by the [place]  FDA and has been authorized for detection and/or diagnosis of SARS-CoV-2 by FDA under an Emergency Use Authorization (EUA). This EUA will remain in effect (meaning this test can be used) for the duration of the COVID-19 declaration under Section 564(b)(1) of the Act, 21 U.S.C. section 360bbb-3(b)(1), unless the authorization is terminated or revoked.     Resp Syncytial Virus by PCR NEGATIVE NEGATIVE Final    Comment: (NOTE) Fact Sheet for Patients: BloggerCourse.com  Fact Sheet for Healthcare Providers: SeriousBroker.it  This test is not yet approved or cleared by the [place]  FDA and has been authorized for detection and/or diagnosis of SARS-CoV-2 by FDA under an Emergency Use Authorization (EUA). This EUA will remain in effect (meaning this test can be used) for  the duration of the COVID-19 declaration under Section 564(b)(1) of the Act, 21 U.S.C. section 360bbb-3(b)(1), unless the authorization is terminated or revoked.  Performed at [facility], [address] [place], [place] [address]   Urine Culture     Status: Abnormal   Collection Time: 05/06/24  2:45 PM   Specimen: Urine, Clean Catch  Result Value Ref Range Status   Specimen Description   Final    URINE, CLEAN CATCH Performed at [facility], [address], [place], [place] [address]    Special Requests   Final    NONE Performed at [facility], [address], [place], [place] [address]    Culture >=100,000 COLONIES/mL ESCHERICHIA COLI (A)  Final   Report Status 05/09/2024 FINAL  Final   Organism ID, Bacteria ESCHERICHIA COLI (A)  Final      Susceptibility   Escherichia coli - MIC*    AMPICILLIN >=32 RESISTANT Resistant     CEFAZOLIN <=4 SENSITIVE Sensitive     CEFEPIME <=0.12 SENSITIVE Sensitive     CEFTRIAXONE  <=0.25 SENSITIVE Sensitive     CIPROFLOXACIN <=0.25 SENSITIVE Sensitive     GENTAMICIN <=1 SENSITIVE Sensitive     IMIPENEM <=0.25 SENSITIVE Sensitive     NITROFURANTOIN <=16 SENSITIVE Sensitive     TRIMETH/SULFA <=20 SENSITIVE Sensitive     AMPICILLIN/SULBACTAM >=32 RESISTANT Resistant     PIP/TAZO <=4 SENSITIVE Sensitive ug/mL    * >=100,000 COLONIES/mL ESCHERICHIA COLI     Labs: BNP (last 3 results) No results for input(s): BNP in the last 8760 hours. Basic Metabolic Panel: Recent Labs  Lab 05/06/24 1445 05/07/24 0356 05/08/24 0403 05/09/24 0408  NA 138 141 140 138  K 3.9 4.3 3.2* 3.3*  CL 103 107 107 104  CO2 22 25 25 24   GLUCOSE 141* 97 98 104*  BUN 20 20 22 19   CREATININE 0.88 0.84 0.84 0.80  CALCIUM  8.8* 8.6* 8.3* 8.3*  MG  --  2.1  --  2.1   Liver Function Tests: Recent Labs  Lab 05/06/24 1445  AST 22  ALT 25  ALKPHOS 85  BILITOT 0.7  PROT 7.5  ALBUMIN 3.0*   No results for input(s): LIPASE, AMYLASE in the last 168  hours. Recent Labs  Lab 05/06/24 1446  AMMONIA 19   CBC: Recent Labs  Lab 05/06/24 1445 05/07/24 0356  WBC 6.8 6.1  NEUTROABS 3.7  --   HGB 14.7 13.8  HCT 43.4 41.7  MCV 100.0 102.5*  PLT 364 316   Cardiac Enzymes: No results for input(s): CKTOTAL, CKMB, CKMBINDEX, TROPONINI in the last 168 hours. BNP: Invalid input(s): POCBNP CBG: No results for input(s): GLUCAP in the last 168 hours. D-Dimer No results for input(s): DDIMER in the last 72 hours. Hgb A1c No results for input(s): HGBA1C in the last 72 hours. Lipid Profile No results for input(s): CHOL, HDL, LDLCALC, TRIG, CHOLHDL, LDLDIRECT in the last 72 hours. Thyroid  function studies No results for input(s): TSH, T4TOTAL, T3FREE, THYROIDAB in the last 72 hours.  Invalid input(s): FREET3 Anemia work up No results for input(s): VITAMINB12, FOLATE, FERRITIN, TIBC, IRON, RETICCTPCT in the last 72 hours. Urinalysis    Component Value Date/Time   COLORURINE AMBER (A) 05/06/2024 1445   APPEARANCEUR HAZY (A) 05/06/2024 1445   LABSPEC 1.019 05/06/2024 1445   PHURINE 5.0 05/06/2024 1445   GLUCOSEU NEGATIVE 05/06/2024 1445   HGBUR SMALL (A) 05/06/2024 1445   BILIRUBINUR NEGATIVE 05/06/2024 1445   KETONESUR NEGATIVE 05/06/2024 1445  PROTEINUR 30 (A) 05/06/2024 1445   NITRITE POSITIVE (A) 05/06/2024 1445   LEUKOCYTESUR MODERATE (A) 05/06/2024 1445   Sepsis Labs Recent Labs  Lab 05/06/24 1445 05/07/24 0356  WBC 6.8 6.1   Microbiology Recent Results (from the past 240 hours)  Resp panel by RT-PCR (RSV, Flu A&B, Covid) Anterior Nasal Swab     Status: None   Collection Time: 05/06/24  2:45 PM   Specimen: Anterior Nasal Swab  Result Value Ref Range Status   SARS Coronavirus 2 by RT PCR NEGATIVE NEGATIVE Final    Comment: (NOTE) SARS-CoV-2 target nucleic acids are NOT DETECTED.  The SARS-CoV-2 RNA is generally detectable in upper respiratory specimens during the  acute phase of infection. The lowest concentration of SARS-CoV-2 viral copies this assay can detect is 138 copies/mL. A negative result does not preclude SARS-Cov-2 infection and should not be used as the sole basis for treatment or other patient management decisions. A negative result may occur with  improper specimen collection/handling, submission of specimen other than nasopharyngeal swab, presence of viral mutation(s) within the areas targeted by this assay, and inadequate number of viral copies(<138 copies/mL). A negative result must be combined with clinical observations, patient history, and epidemiological information. The expected result is Negative.  Fact Sheet for Patients:  BloggerCourse.com  Fact Sheet for Healthcare Providers:  SeriousBroker.it  This test is no t yet approved or cleared by the [place]  FDA and  has been authorized for detection and/or diagnosis of SARS-CoV-2 by FDA under an Emergency Use Authorization (EUA). This EUA will remain  in effect (meaning this test can be used) for the duration of the COVID-19 declaration under Section 564(b)(1) of the Act, 21 U.S.C.section 360bbb-3(b)(1), unless the authorization is terminated  or revoked sooner.       Influenza A by PCR NEGATIVE NEGATIVE Final   Influenza B by PCR NEGATIVE NEGATIVE Final    Comment: (NOTE) The Xpert Xpress SARS-CoV-2/FLU/RSV plus assay is intended as an aid in the diagnosis of influenza from Nasopharyngeal swab specimens and should not be used as a sole basis for treatment. Nasal washings and aspirates are unacceptable for Xpert Xpress SARS-CoV-2/FLU/RSV testing.  Fact Sheet for Patients: BloggerCourse.com  Fact Sheet for Healthcare Providers: SeriousBroker.it  This test is not yet approved or cleared by the [place]  FDA and has been authorized for detection and/or  diagnosis of SARS-CoV-2 by FDA under an Emergency Use Authorization (EUA). This EUA will remain in effect (meaning this test can be used) for the duration of the COVID-19 declaration under Section 564(b)(1) of the Act, 21 U.S.C. section 360bbb-3(b)(1), unless the authorization is terminated or revoked.     Resp Syncytial Virus by PCR NEGATIVE NEGATIVE Final    Comment: (NOTE) Fact Sheet for Patients: BloggerCourse.com  Fact Sheet for Healthcare Providers: SeriousBroker.it  This test is not yet approved or cleared by the [place]  FDA and has been authorized for detection and/or diagnosis of SARS-CoV-2 by FDA under an Emergency Use Authorization (EUA). This EUA will remain in effect (meaning this test can be used) for the duration of the COVID-19 declaration under Section 564(b)(1) of the Act, 21 U.S.C. section 360bbb-3(b)(1), unless the authorization is terminated or revoked.  Performed at [facility], [address] [place], [place] [address]   Urine Culture     Status: Abnormal   Collection Time: 05/06/24  2:45 PM   Specimen: Urine, Clean Catch  Result Value Ref Range Status   Specimen Description   Final  URINE, CLEAN CATCH Performed at [facility], [address] [place], [place] [address]    Special Requests   Final    NONE Performed at [facility], [address] [place], [place] [address]    Culture >=100,000 COLONIES/mL ESCHERICHIA COLI (A)  Final   Report Status 05/09/2024 FINAL  Final   Organism ID, Bacteria ESCHERICHIA COLI (A)  Final      Susceptibility   Escherichia coli - MIC*    AMPICILLIN >=32 RESISTANT Resistant     CEFAZOLIN <=4 SENSITIVE Sensitive     CEFEPIME <=0.12 SENSITIVE Sensitive     CEFTRIAXONE  <=0.25 SENSITIVE Sensitive     CIPROFLOXACIN <=0.25 SENSITIVE Sensitive     GENTAMICIN <=1 SENSITIVE Sensitive     IMIPENEM <=0.25 SENSITIVE Sensitive     NITROFURANTOIN <=16 SENSITIVE  Sensitive     TRIMETH/SULFA <=20 SENSITIVE Sensitive     AMPICILLIN/SULBACTAM >=32 RESISTANT Resistant     PIP/TAZO <=4 SENSITIVE Sensitive ug/mL    * >=100,000 COLONIES/mL ESCHERICHIA COLI   Time coordinating discharge:  35 mins   SIGNED:  Afton Louder, MD  [facility] 05/10/2024, 10:46 AM How to contact the [facility] Attending or Consulting provider 7A - 7P or covering provider during after hours 7P -7A, for this patient?  Check the care team in [facility] and look for a) attending/consulting [facility] provider listed and b) the [facility] team listed Log into [email or web address] and use [facility]'s universal password to access. If you do not have the password, please contact the hospital operator. Locate the [facility] provider you are looking for under [facility] and page to a number that you can be directly reached. If you still have difficulty reaching the provider, please page the [facility] (Director on Call) for the Hospitalists listed on amion for assistance.

## 2024-05-11 NOTE — Transitions of Care (Post Inpatient/ED Visit) (Unsigned)
 05/11/2024  Patient ID: Christopher Mccoy, male   DOB: 12-02-1945, 78 y.o.   MRN: [id number]  05/11/24: Chart review for potential TOC post discharge outreach. Patient noted to be active with [facility] for [facility]. Non-[facility] provider.   Bing Edison MSN, RN RN Case Sales executive Health  [facility] Office Hours M-F [phone] Direct Dial: [phone] Main Phone [phone]  Fax: [phone] [email or web address]

## 2024-07-05 ENCOUNTER — Emergency Department (HOSPITAL_COMMUNITY)

## 2024-07-05 ENCOUNTER — Other Ambulatory Visit: Payer: Self-pay

## 2024-07-05 ENCOUNTER — Encounter (HOSPITAL_COMMUNITY): Payer: Self-pay | Admitting: Emergency Medicine

## 2024-07-05 ENCOUNTER — Inpatient Hospital Stay (HOSPITAL_COMMUNITY)
Admission: EM | Admit: 2024-07-05 | Discharge: 2024-07-12 | DRG: 071 | Disposition: A | Discharge status: Hospice | Attending: Internal Medicine | Admitting: Internal Medicine

## 2024-07-05 DIAGNOSIS — F02818 Dementia in other diseases classified elsewhere, unspecified severity, with other behavioral disturbance: Secondary | ICD-10-CM | POA: Diagnosis present

## 2024-07-05 DIAGNOSIS — Z880 Allergy status to penicillin: Secondary | ICD-10-CM

## 2024-07-05 DIAGNOSIS — Z8249 Family history of ischemic heart disease and other diseases of the circulatory system: Secondary | ICD-10-CM

## 2024-07-05 DIAGNOSIS — F419 Anxiety disorder, unspecified: Secondary | ICD-10-CM | POA: Insufficient documentation

## 2024-07-05 DIAGNOSIS — F17210 Nicotine dependence, cigarettes, uncomplicated: Secondary | ICD-10-CM | POA: Diagnosis present

## 2024-07-05 DIAGNOSIS — F0283 Dementia in other diseases classified elsewhere, unspecified severity, with mood disturbance: Secondary | ICD-10-CM | POA: Diagnosis present

## 2024-07-05 DIAGNOSIS — E8809 Other disorders of plasma-protein metabolism, not elsewhere classified: Secondary | ICD-10-CM | POA: Diagnosis present

## 2024-07-05 DIAGNOSIS — Z79899 Other long term (current) drug therapy: Secondary | ICD-10-CM

## 2024-07-05 DIAGNOSIS — E876 Hypokalemia: Secondary | ICD-10-CM | POA: Diagnosis not present

## 2024-07-05 DIAGNOSIS — Z87440 Personal history of urinary (tract) infections: Secondary | ICD-10-CM

## 2024-07-05 DIAGNOSIS — F32A Depression, unspecified: Secondary | ICD-10-CM | POA: Diagnosis present

## 2024-07-05 DIAGNOSIS — F03918 Unspecified dementia, unspecified severity, with other behavioral disturbance: Secondary | ICD-10-CM

## 2024-07-05 DIAGNOSIS — G309 Alzheimer's disease, unspecified: Secondary | ICD-10-CM | POA: Diagnosis present

## 2024-07-05 DIAGNOSIS — E872 Acidosis, unspecified: Secondary | ICD-10-CM | POA: Diagnosis present

## 2024-07-05 DIAGNOSIS — Z66 Do not resuscitate: Secondary | ICD-10-CM | POA: Diagnosis present

## 2024-07-05 DIAGNOSIS — Z888 Allergy status to other drugs, medicaments and biological substances status: Secondary | ICD-10-CM

## 2024-07-05 DIAGNOSIS — R4182 Altered mental status, unspecified: Secondary | ICD-10-CM | POA: Diagnosis not present

## 2024-07-05 DIAGNOSIS — F0284 Dementia in other diseases classified elsewhere, unspecified severity, with anxiety: Secondary | ICD-10-CM | POA: Diagnosis present

## 2024-07-05 DIAGNOSIS — G9341 Metabolic encephalopathy: Secondary | ICD-10-CM | POA: Diagnosis not present

## 2024-07-05 DIAGNOSIS — E860 Dehydration: Secondary | ICD-10-CM | POA: Diagnosis not present

## 2024-07-05 DIAGNOSIS — I493 Ventricular premature depolarization: Secondary | ICD-10-CM | POA: Diagnosis present

## 2024-07-05 LAB — COMPREHENSIVE METABOLIC PANEL WITH GFR
ALT: 31 U/L (ref 0–44)
AST: 25 U/L (ref 15–41)
Albumin: 3.7 g/dL (ref 3.5–5.0)
Alkaline Phosphatase: 81 U/L (ref 38–126)
Anion gap: 12 (ref 5–15)
BUN: 26 mg/dL — ABNORMAL HIGH (ref 8–23)
CO2: 24 mmol/L (ref 22–32)
Calcium: 8.8 mg/dL — ABNORMAL LOW (ref 8.9–10.3)
Chloride: 106 mmol/L (ref 98–111)
Creatinine, Ser: 0.69 mg/dL (ref 0.61–1.24)
GFR, Estimated: >60 mL/min (ref >60)
Glucose, Bld: 116 mg/dL — ABNORMAL HIGH (ref 70–99)
Potassium: 3.2 mmol/L — ABNORMAL LOW (ref 3.5–5.1)
Sodium: 142 mmol/L (ref 135–145)
Total Bilirubin: 1 mg/dL (ref 0.0–1.2)
Total Protein: 7 g/dL (ref 6.5–8.1)

## 2024-07-05 LAB — URINALYSIS, ROUTINE W REFLEX MICROSCOPIC
Bilirubin Urine: NEGATIVE
Glucose, UA: NEGATIVE mg/dL
Hgb urine dipstick: NEGATIVE
Ketones, ur: 5 mg/dL — AB
Leukocytes,Ua: NEGATIVE
Nitrite: NEGATIVE
Protein, ur: NEGATIVE mg/dL
Specific Gravity, Urine: 1.026 (ref 1.005–1.030)
pH: 5 (ref 5.0–8.0)

## 2024-07-05 LAB — CBC WITH DIFFERENTIAL/PLATELET
Abs Immature Granulocytes: 0.01 K/uL (ref 0.00–0.07)
Basophils Absolute: 0 K/uL (ref 0.0–0.1)
Basophils Relative: 1 %
Eosinophils Absolute: 0.1 K/uL (ref 0.0–0.5)
Eosinophils Relative: 1 %
HCT: 44.6 % (ref 39.0–52.0)
Hemoglobin: 14.8 g/dL (ref 13.0–17.0)
Immature Granulocytes: 0 %
Lymphocytes Relative: 28 %
Lymphs Abs: 1.7 K/uL (ref 0.7–4.0)
MCH: 33.1 pg (ref 26.0–34.0)
MCHC: 33.2 g/dL (ref 30.0–36.0)
MCV: 99.8 fL (ref 80.0–100.0)
Monocytes Absolute: 0.5 K/uL (ref 0.1–1.0)
Monocytes Relative: 7 %
Neutro Abs: 3.9 K/uL (ref 1.7–7.7)
Neutrophils Relative %: 63 %
Platelets: 243 K/uL (ref 150–400)
RBC: 4.47 MIL/uL (ref 4.22–5.81)
RDW: 13.2 % (ref 11.5–15.5)
WBC: 6.1 K/uL (ref 4.0–10.5)
nRBC: 0 % (ref 0.0–0.2)

## 2024-07-05 LAB — MAGNESIUM: Magnesium: 2.2 mg/dL (ref 1.7–2.4)

## 2024-07-05 LAB — LACTIC ACID, PLASMA: Lactic Acid, Venous: 1.3 mmol/L (ref 0.5–1.9)

## 2024-07-05 MED ORDER — ACETAMINOPHEN 650 MG RE SUPP: 650.0000 mg | Freq: Four times a day (QID) | RECTAL | Status: DC | PRN

## 2024-07-05 MED ORDER — LORAZEPAM 2 MG/ML IJ SOLN
1.0000 mg | Freq: Once | INTRAMUSCULAR | Status: AC
  Administered 2024-07-05: 1 mg via INTRAVENOUS
  Filled 2024-07-05: qty 1

## 2024-07-05 MED ORDER — LORAZEPAM 0.5 MG PO TABS: 0.5000 mg | ORAL_TABLET | Freq: Four times a day (QID) | ORAL | Status: DC | PRN

## 2024-07-05 MED ORDER — VITAMIN B-12 1000 MCG PO TABS
1000.0000 ug | ORAL_TABLET | Freq: Every day | ORAL | Status: DC
  Administered 2024-07-07 – 2024-07-12 (×5): 1000 ug via ORAL
  Filled 2024-07-05 (×6): qty 1

## 2024-07-05 MED ORDER — QUETIAPINE FUMARATE 25 MG PO TABS: 50.0000 mg | ORAL_TABLET | ORAL | Status: DC

## 2024-07-05 MED ORDER — POTASSIUM CHLORIDE 10 MEQ/100ML IV SOLN
10.0000 meq | INTRAVENOUS | Status: AC
  Administered 2024-07-05 – 2024-07-06 (×4): 10 meq via INTRAVENOUS
  Filled 2024-07-05 (×4): qty 100

## 2024-07-05 MED ORDER — CITALOPRAM HYDROBROMIDE 20 MG PO TABS
20.0000 mg | ORAL_TABLET | Freq: Every day | ORAL | Status: DC
  Administered 2024-07-07 – 2024-07-12 (×5): 20 mg via ORAL
  Filled 2024-07-05 (×6): qty 1

## 2024-07-05 MED ORDER — ONDANSETRON HCL 4 MG/2ML IJ SOLN: 4.0000 mg | Freq: Four times a day (QID) | INTRAMUSCULAR | Status: DC | PRN

## 2024-07-05 MED ORDER — ACETAMINOPHEN 325 MG PO TABS: 650.0000 mg | ORAL_TABLET | Freq: Four times a day (QID) | ORAL | Status: DC | PRN

## 2024-07-05 MED ORDER — SODIUM CHLORIDE 0.9 % IV SOLN: INTRAVENOUS | Status: AC

## 2024-07-05 MED ORDER — SENNOSIDES-DOCUSATE SODIUM 8.6-50 MG PO TABS: 2.0000 | ORAL_TABLET | Freq: Every evening | ORAL | Status: DC | PRN

## 2024-07-05 MED ORDER — ENOXAPARIN SODIUM 40 MG/0.4ML IJ SOSY
40.0000 mg | PREFILLED_SYRINGE | INTRAMUSCULAR | Status: DC
  Administered 2024-07-06 – 2024-07-12 (×7): 40 mg via SUBCUTANEOUS
  Filled 2024-07-05 (×7): qty 0.4

## 2024-07-05 MED ORDER — MEMANTINE HCL 10 MG PO TABS
10.0000 mg | ORAL_TABLET | Freq: Two times a day (BID) | ORAL | Status: DC
  Administered 2024-07-06 – 2024-07-12 (×11): 10 mg via ORAL
  Filled 2024-07-05 (×12): qty 1

## 2024-07-05 MED ORDER — ONDANSETRON HCL 4 MG PO TABS: 4.0000 mg | ORAL_TABLET | Freq: Four times a day (QID) | ORAL | Status: DC | PRN

## 2024-07-05 MED ORDER — TRAZODONE HCL 50 MG PO TABS
25.0000 mg | ORAL_TABLET | Freq: Every evening | ORAL | Status: DC | PRN
  Administered 2024-07-06: 25 mg via ORAL
  Filled 2024-07-05: qty 1

## 2024-07-05 NOTE — ED Notes (Signed)
 Sz pads placed on both side rails. Pt muttering and very difficult to understand. Family member reports that this is near his baseline. Coban placed over both Ivs to reduce the risk the pt will accidentally d/c IVs.

## 2024-07-05 NOTE — H&P (Incomplete)
 [place]   PATIENT NAME: Christopher Mccoy    MR#:  [id number]  DATE OF BIRTH:  07/21/1946  DATE OF ADMISSION:  07/05/2024  PRIMARY CARE PHYSICIAN: [facility]   Patient is coming from: Home  REQUESTING/REFERRING PHYSICIAN: Daralene Bruckner, PA-C  CHIEF COMPLAINT:   Chief Complaint  Patient presents with   Altered Mental Status    HISTORY OF PRESENT ILLNESS:  Christopher Mccoy is a 78 y.o. male with medical history significant for Alzheimer's disease, who presented to the emergency room with acute onset of altered mental status with confusion.  The patient has not been acting himself since last week per his wife.  He did not have any reported urinary frequency or urgency or dysuria or hematuria.  No cough or wheezing or dyspnea.  No nausea or vomiting or abdominal pain.  No fever or chills.  No bleeding diathesis. No recent falls. ED Course: When he came to the ER vital signs were within normal.  Labs revealed hypokalemia 3.2 and BUN of 26 with calcium  of 8.8.  CBC was normal.  UA was negative.  Blood cultures were drawn. EKG as reviewed by me : None Imaging: Noncontrasted head CT scan showed chronic changes with no acute normalities.  Portable chest x-ray showed no acute cardiopulmonary disease.  The patient was given 10 medical IV potassium chloride  and 1 mg of IV Ativan .  He will be admitted to a medical telemetry observation bed for further evaluation and management. PAST MEDICAL HISTORY:   Past Medical History:  Diagnosis Date   Alzheimer's dementia (HCC)    Left arm pain     PAST SURGICAL HISTORY:   Past Surgical History:  Procedure Laterality Date   KNEE SURGERY     NM PET IMG ALZHEIMERS     SHOULDER SURGERY      SOCIAL HISTORY:   Social History   Tobacco Use   Smoking status: Every Day    Types: Cigarettes   Smokeless tobacco: Not on file  Substance Use Topics   Alcohol  use: Not Currently    FAMILY HISTORY:   Family History   Problem Relation Age of Onset   Heart failure Mother    Leukemia Father     DRUG ALLERGIES:   Allergies  Allergen Reactions   Penicillins Anaphylaxis   Haldol  [Haloperidol ] Itching and Other (See Comments)    Aggressive, confused, irritated, very altered    REVIEW OF SYSTEMS:   ROS As per history of present illness. All pertinent systems were reviewed above. Constitutional, HEENT, cardiovascular, respiratory, GI, GU, musculoskeletal, neuro, psychiatric, endocrine, integumentary and hematologic systems were reviewed and are otherwise negative/unremarkable except for positive findings mentioned above in the HPI.   MEDICATIONS AT HOME:   Prior to Admission medications   Medication Sig Start Date End Date Taking? Authorizing Provider  acetaminophen  (TYLENOL ) 325 MG tablet Take 2 tablets (650 mg total) by mouth every 6 (six) hours as needed for mild pain (pain score 1-3) or fever (or Fever >/= 101). 04/17/24   Emokpae, Courage, MD  citalopram  (CELEXA ) 20 MG tablet Take 1 tablet (20 mg total) by mouth daily. 04/17/24 04/17/25  Pearlean Manus, MD  cyanocobalamin  1000 MCG tablet Take 1,000 mcg by mouth daily.    [provider]  LORazepam  (ATIVAN ) 0.5 MG tablet Take 1-2 tablets (0.5-1 mg total) by mouth every 6 (six) hours as needed for anxiety (severe agitation). 05/10/24   Vicci Afton CROME, MD  memantine  (NAMENDA ) 10  MG tablet Take 1 tablet (10 mg total) by mouth 2 (two) times daily. 04/17/24   Pearlean Manus, MD  QUEtiapine  (SEROQUEL ) 50 MG tablet Take 1 tablet (50 mg total) by mouth See admin instructions. Take 150 mg at bedtime and 50 mg in the morning 05/10/24   Johnson, Clanford L, MD  senna-docusate (SENOKOT-S) 8.6-50 MG tablet Take 2 tablets by mouth at bedtime as needed for mild constipation. 05/10/24   Johnson, Clanford L, MD      VITAL SIGNS:  Blood pressure 123/87, pulse 89, temperature (!) 97.3 F (36.3 C), temperature source Temporal, resp. rate (!) 22, height 5'  10 (1.778 m), weight 79 kg, SpO2 96%.  PHYSICAL EXAMINATION:  Physical Exam  GENERAL: Restless 78 y.o.-year-old patient lying in the bed with no acute distress, sleeping horizontally in bed.SABRA  EYES: Pupils equal, round, reactive to light and accommodation. No scleral icterus. Extraocular muscles intact.  HEENT: Head atraumatic, normocephalic. Oropharynx and nasopharynx clear.  NECK:  Supple, no jugular venous distention. No thyroid  enlargement, no tenderness.  LUNGS: Normal breath sounds bilaterally, no wheezing, rales,rhonchi or crepitation. No use of accessory muscles of respiration.  CARDIOVASCULAR: Regular rate and rhythm, S1, S2 normal. No murmurs, rubs, or gallops.  ABDOMEN: Soft, nondistended, nontender. Bowel sounds present. No organomegaly or mass.  EXTREMITIES: No pedal edema, cyanosis, or clubbing.  NEUROLOGIC: Cranial nerves II through XII are intact. Muscle strength 5/5 in all extremities. Sensation intact. Gait not checked.  PSYCHIATRIC: The patient is alert and oriented x 3.  Normal affect and good eye contact. SKIN: No obvious rash, lesion, or ulcer.   LABORATORY PANEL:   CBC Recent Labs  Lab 07/05/24 1931  WBC 6.1  HGB 14.8  HCT 44.6  PLT 243   ------------------------------------------------------------------------------------------------------------------  Chemistries  Recent Labs  Lab 07/05/24 1931  NA 142  K 3.2*  CL 106  CO2 24  GLUCOSE 116*  BUN 26*  CREATININE 0.69  CALCIUM  8.8*  MG 2.2  AST 25  ALT 31  ALKPHOS 81  BILITOT 1.0   ------------------------------------------------------------------------------------------------------------------  Cardiac Enzymes No results for input(s): TROPONINI in the last 168 hours. ------------------------------------------------------------------------------------------------------------------  RADIOLOGY:  DG Chest Port 1 View Result Date: 07/05/2024 CLINICAL DATA:  Weakness and altered mental  status. EXAM: PORTABLE CHEST 1 VIEW COMPARISON:  04/13/2024 FINDINGS: The cardiomediastinal contours are stable. Chronic elevation of right hemidiaphragm. Pulmonary vasculature is normal. No consolidation, pleural effusion, or pneumothorax. No acute osseous abnormalities are seen. IMPRESSION: No active disease. Electronically Signed   By: Andrea Gasman M.D.   On: 07/05/2024 21:27   CT Head Wo Contrast Result Date: 07/05/2024 CLINICAL DATA:  Altered mental status EXAM: CT HEAD WITHOUT CONTRAST TECHNIQUE: Contiguous axial images were obtained from the base of the skull through the vertex without intravenous contrast. RADIATION DOSE REDUCTION: This exam was performed according to the departmental dose-optimization program which includes automated exposure control, adjustment of the mA and/or kV according to patient size and/or use of iterative reconstruction technique. COMPARISON:  05/06/2024 FINDINGS: Brain: No evidence of acute infarction, hemorrhage, hydrocephalus, extra-axial collection or mass lesion/mass effect. Chronic atrophic and white matter ischemic changes noted. Vascular: No hyperdense vessel or unexpected calcification. Skull: Normal. Negative for fracture or focal lesion. Sinuses/Orbits: No acute finding. Other: None. IMPRESSION: Chronic changes without acute abnormality. No change from the prior exam. Electronically Signed   By: Oneil Devonshire M.D.   On: 07/05/2024 20:11      IMPRESSION AND PLAN:  Assessment and Plan: * Acute  metabolic encephalopathy - This is of unclear etiology. - There is no clear infectious reason. - Will be admitted to an observation medical telemetry bed. - We will place him on as needed Ativan  that helped and IM Geodon . - He did not respond much to IM Haldol . - Will follow neurochecks every 4 hours for 24 hours.  Hypokalemia - Potassium will be replaced  Dementia with behavioral disturbance (HCC) - Will continue Namenda  and Seroquel   Dehydration - This  is manifested by mild acidemia. - He will be hydrated with IV normal saline and will follow BMP.  Anxiety and depression Will resume Ativan  and Celexa .     DVT prophylaxis: Lovenox .  Advanced Care Planning:  Code Status: The patient is DNR and DNI.  He was discussed with his wife. Family Communication:  The plan of care was discussed in details with the patient (and family). I answered all questions. The patient agreed to proceed with the above mentioned plan. Further management will depend upon hospital course. Disposition Plan: Back to previous home environment Consults called: none.  All the records are reviewed and case discussed with ED provider.  Status is: Observation  I certify that at the time of admission, it is my clinical judgment that the patient will require  hospital care extending less than 2 midnights.                            Dispo: The patient is from: Home              Anticipated d/c is to: Home              Patient currently is not medically stable to d/c.              Difficult to place patient: No  Madison DELENA Peaches M.D on 07/06/2024 at 4:53 AM  [facility]   From 7 PM-7 AM, contact night-coverage [email or web address]  CC: Primary care physician; [facility]

## 2024-07-05 NOTE — ED Notes (Signed)
 Patient to agitated to attempt EKG at this time. Medications given. Will attempt once he is calmer.

## 2024-07-05 NOTE — ED Provider Notes (Signed)
 [place] EMERGENCY DEPARTMENT AT [facility] Provider Note   CSN: [id number] Arrival date & time: 07/05/24  1751     Patient presents with: Altered Mental Status   Christopher Mccoy is a 78 y.o. male.   Patient is a 78 year old male who presents to the emergency department secondary to altered mental status.  Patient is unable to provide his own history and history is obtained from the spouse.  She notes that he has increased altered mental status and agitation for approximate the past week.  She notes that he has had previous urinary tract infections and have had symptoms similar to this.  She does note that his urine has been darker than normal.  She notes that he has had no recent falls or blunt trauma.  He has had no recent medication changes.  He does have a history of dementia.  She notes he has had no other acute complaints at home.   Altered Mental Status Presenting symptoms: confusion   Associated symptoms: agitation        Prior to Admission medications   Medication Sig Start Date End Date Taking? Authorizing Provider  acetaminophen  (TYLENOL ) 325 MG tablet Take 2 tablets (650 mg total) by mouth every 6 (six) hours as needed for mild pain (pain score 1-3) or fever (or Fever >/= 101). 04/17/24   Emokpae, Courage, MD  citalopram  (CELEXA ) 20 MG tablet Take 1 tablet (20 mg total) by mouth daily. 04/17/24 04/17/25  Pearlean Manus, MD  cyanocobalamin  1000 MCG tablet Take 1,000 mcg by mouth daily.    [provider]  LORazepam  (ATIVAN ) 0.5 MG tablet Take 1-2 tablets (0.5-1 mg total) by mouth every 6 (six) hours as needed for anxiety (severe agitation). 05/10/24   Johnson, Clanford L, MD  memantine  (NAMENDA ) 10 MG tablet Take 1 tablet (10 mg total) by mouth 2 (two) times daily. 04/17/24   Pearlean Manus, MD  QUEtiapine  (SEROQUEL ) 50 MG tablet Take 1 tablet (50 mg total) by mouth See admin instructions. Take 150 mg at bedtime and 50 mg in the morning 05/10/24   Johnson,  Clanford L, MD  senna-docusate (SENOKOT-S) 8.6-50 MG tablet Take 2 tablets by mouth at bedtime as needed for mild constipation. 05/10/24   Vicci Afton CROME, MD    Allergies: Penicillins and Haldol  [haloperidol ]    Review of Systems  Psychiatric/Behavioral:  Positive for agitation and confusion.   All other systems reviewed and are negative.   Updated Vital Signs BP 106/80   Pulse 83   Temp 97.6 F (36.4 C) (Axillary)   Resp 15   Ht 5' 10 (1.778 m)   Wt 79 kg   SpO2 96%   BMI 24.99 kg/m   Physical Exam Vitals and nursing note reviewed.  Constitutional:      General: He is not in acute distress.    Appearance: Normal appearance. He is not ill-appearing.     Comments: Restless  HENT:     Head: Normocephalic and atraumatic.     Nose: Nose normal.     Mouth/Throat:     Mouth: Mucous membranes are moist.  Eyes:     Extraocular Movements: Extraocular movements intact.     Conjunctiva/sclera: Conjunctivae normal.     Pupils: Pupils are equal, round, and reactive to light.  Cardiovascular:     Rate and Rhythm: Normal rate and regular rhythm.     Pulses: Normal pulses.     Heart sounds: Normal heart sounds. No murmur heard.  No gallop.  Pulmonary:     Effort: Pulmonary effort is normal. No respiratory distress.     Breath sounds: Normal breath sounds. No stridor. No wheezing, rhonchi or rales.  Abdominal:     General: Abdomen is flat. Bowel sounds are normal. There is no distension.     Palpations: Abdomen is soft.     Tenderness: There is no abdominal tenderness. There is no guarding.  Musculoskeletal:        General: Normal range of motion.     Cervical back: Normal range of motion and neck supple. No rigidity or tenderness.  Skin:    General: Skin is warm and dry.     Findings: No bruising or rash.  Neurological:     General: No focal deficit present.     Mental Status: He is alert. He is disoriented.  Psychiatric:        Mood and Affect: Mood normal.         Behavior: Behavior normal.        Thought Content: Thought content normal.        Judgment: Judgment normal.     (all labs ordered are listed, but only abnormal results are displayed) Labs Reviewed  COMPREHENSIVE METABOLIC PANEL WITH GFR - Abnormal; Notable for the following components:      Result Value   Potassium 3.2 (*)    Glucose, Bld 116 (*)    BUN 26 (*)    Calcium  8.8 (*)    All other components within normal limits  URINALYSIS, ROUTINE W REFLEX MICROSCOPIC - Abnormal; Notable for the following components:   Ketones, ur 5 (*)    All other components within normal limits  CULTURE, BLOOD (ROUTINE X 2)  CULTURE, BLOOD (ROUTINE X 2)  CBC WITH DIFFERENTIAL/PLATELET  LACTIC ACID, PLASMA  MAGNESIUM  BASIC METABOLIC PANEL WITH GFR  CBC    EKG: None  Radiology: [facility] Chest Port 1 View Result Date: 07/05/2024 CLINICAL DATA:  Weakness and altered mental status. EXAM: PORTABLE CHEST 1 VIEW COMPARISON:  04/13/2024 FINDINGS: The cardiomediastinal contours are stable. Chronic elevation of right hemidiaphragm. Pulmonary vasculature is normal. No consolidation, pleural effusion, or pneumothorax. No acute osseous abnormalities are seen. IMPRESSION: No active disease. Electronically Signed   By: Andrea Gasman M.D.   On: 07/05/2024 21:27   CT Head Wo Contrast Result Date: 07/05/2024 CLINICAL DATA:  Altered mental status EXAM: CT HEAD WITHOUT CONTRAST TECHNIQUE: Contiguous axial images were obtained from the base of the skull through the vertex without intravenous contrast. RADIATION DOSE REDUCTION: This exam was performed according to the departmental dose-optimization program which includes automated exposure control, adjustment of the mA and/or kV according to patient size and/or use of iterative reconstruction technique. COMPARISON:  05/06/2024 FINDINGS: Brain: No evidence of acute infarction, hemorrhage, hydrocephalus, extra-axial collection or mass lesion/mass effect. Chronic atrophic and  white matter ischemic changes noted. Vascular: No hyperdense vessel or unexpected calcification. Skull: Normal. Negative for fracture or focal lesion. Sinuses/Orbits: No acute finding. Other: None. IMPRESSION: Chronic changes without acute abnormality. No change from the prior exam. Electronically Signed   By: Oneil Devonshire M.D.   On: 07/05/2024 20:11     Procedures   Medications Ordered in the ED  potassium chloride  10 mEq in 100 mL IVPB (10 mEq Intravenous New Bag/Given 07/05/24 2117)  citalopram  (CELEXA ) tablet 20 mg (has no administration in time range)  memantine  (NAMENDA ) tablet 10 mg (has no administration in time range)  QUEtiapine  (SEROQUEL ) tablet 50  mg (has no administration in time range)  senna-docusate (Senokot-S) tablet 2 tablet (has no administration in time range)  cyanocobalamin  (VITAMIN B12) tablet 1,000 mcg (has no administration in time range)  LORazepam  (ATIVAN ) tablet 0.5-1 mg (has no administration in time range)  enoxaparin  (LOVENOX ) injection 40 mg (has no administration in time range)  0.9 %  sodium chloride  infusion (has no administration in time range)  acetaminophen  (TYLENOL ) tablet 650 mg (has no administration in time range)    Or  acetaminophen  (TYLENOL ) suppository 650 mg (has no administration in time range)  traZODone  (DESYREL ) tablet 25 mg (has no administration in time range)  ondansetron  (ZOFRAN ) tablet 4 mg (has no administration in time range)    Or  ondansetron  (ZOFRAN ) injection 4 mg (has no administration in time range)  LORazepam  (ATIVAN ) injection 1 mg (1 mg Intravenous Given 07/05/24 2027)                                    Medical Decision Making Amount and/or Complexity of Data Reviewed Labs: ordered. Radiology: ordered.  Risk Prescription drug management. Decision regarding hospitalization.   This patient presents to the ED for concern of mental status, this involves an extensive number of treatment options, and is a complaint that  carries with it a high risk of complications and morbidity.  The differential diagnosis includes urinary tract infection, intracranial hemorrhage, CVA, TIA, meningitis, encephalitis, dehydration, electrolyte derangement   Co morbidities that complicate the patient evaluation  Dementia   Additional history obtained:  Additional history obtained from spouse External records from outside source obtained and reviewed including medical records   Lab Tests:  I Ordered, and personally interpreted labs.  The pertinent results include: No leukocytosis, no anemia, normal kidney function liver function, mild hypokalemia, normal lactic acid, unremarkable urinalysis   Imaging Studies ordered:  I ordered imaging studies including CT scan head, chest x-ray I independently visualized and interpreted imaging which showed no acute intracranial hemorrhage, no acute cardiopulmonary process I agree with the radiologist interpretation    Consultations Obtained:  I requested consultation with the hospitalist,  and discussed lab and imaging findings as well as pertinent plan - they recommend: Head   Problem List / ED Course / Critical interventions / Medication management  Patient is doing well at this time and does remain stable.  He does remain altered and agitated.  He was given a dose of Ativan  in the emergency department.  He has no indication of urinary tract infection at this point.  Potassium repletion was started in the emergency department.  Low suspicion for encephalitis or meningitis at this point.  Symptoms may be secondary to worsening dementia.  He does live at home with his elderly wife and will plan for admission to the hospital service given his increased confusion.  IV fluids have been started as well.  Patient has had no recent falls or blunt head trauma.  He has had no recent medication changes but wife notes that he is on Seroquel .  Patient case has been discussed with Dr. Lawence with  the hospitalist service who has excepted for admission. I ordered medication including IV fluids, potassium for dehydration and hypokalemia Reevaluation of the patient after these medicines showed that the patient improved I have reviewed the patients home medicines and have made adjustments as needed   Social Determinants of Health:  Lives with wife, dementia   Test /  Admission - Considered:  Admission     Final diagnoses:  None    ED Discharge Orders     None          Daralene Lonni JONETTA DEVONNA 07/05/24 2234    Patsey Lot, MD 07/05/24 2325

## 2024-07-05 NOTE — ED Triage Notes (Signed)
 Ems was called out for increased confusion. Pt has history of dementia.

## 2024-07-05 NOTE — ED Notes (Signed)
 Attempted to obtain EKG, pt unable to stay still

## 2024-07-06 DIAGNOSIS — G9341 Metabolic encephalopathy: Secondary | ICD-10-CM | POA: Diagnosis present

## 2024-07-06 DIAGNOSIS — F02818 Dementia in other diseases classified elsewhere, unspecified severity, with other behavioral disturbance: Secondary | ICD-10-CM | POA: Diagnosis present

## 2024-07-06 DIAGNOSIS — E8809 Other disorders of plasma-protein metabolism, not elsewhere classified: Secondary | ICD-10-CM | POA: Diagnosis present

## 2024-07-06 DIAGNOSIS — F32A Depression, unspecified: Secondary | ICD-10-CM | POA: Diagnosis present

## 2024-07-06 DIAGNOSIS — Z87440 Personal history of urinary (tract) infections: Secondary | ICD-10-CM | POA: Diagnosis not present

## 2024-07-06 DIAGNOSIS — F17210 Nicotine dependence, cigarettes, uncomplicated: Secondary | ICD-10-CM | POA: Diagnosis present

## 2024-07-06 DIAGNOSIS — Z8249 Family history of ischemic heart disease and other diseases of the circulatory system: Secondary | ICD-10-CM | POA: Diagnosis not present

## 2024-07-06 DIAGNOSIS — E860 Dehydration: Secondary | ICD-10-CM

## 2024-07-06 DIAGNOSIS — F419 Anxiety disorder, unspecified: Secondary | ICD-10-CM | POA: Insufficient documentation

## 2024-07-06 DIAGNOSIS — F0283 Dementia in other diseases classified elsewhere, unspecified severity, with mood disturbance: Secondary | ICD-10-CM | POA: Diagnosis present

## 2024-07-06 DIAGNOSIS — F0284 Dementia in other diseases classified elsewhere, unspecified severity, with anxiety: Secondary | ICD-10-CM | POA: Diagnosis present

## 2024-07-06 DIAGNOSIS — E876 Hypokalemia: Secondary | ICD-10-CM | POA: Diagnosis present

## 2024-07-06 DIAGNOSIS — G309 Alzheimer's disease, unspecified: Secondary | ICD-10-CM | POA: Diagnosis present

## 2024-07-06 DIAGNOSIS — I493 Ventricular premature depolarization: Secondary | ICD-10-CM | POA: Diagnosis present

## 2024-07-06 DIAGNOSIS — Z888 Allergy status to other drugs, medicaments and biological substances status: Secondary | ICD-10-CM | POA: Diagnosis not present

## 2024-07-06 DIAGNOSIS — Z79899 Other long term (current) drug therapy: Secondary | ICD-10-CM | POA: Diagnosis not present

## 2024-07-06 DIAGNOSIS — Z880 Allergy status to penicillin: Secondary | ICD-10-CM | POA: Diagnosis not present

## 2024-07-06 DIAGNOSIS — R4182 Altered mental status, unspecified: Secondary | ICD-10-CM | POA: Diagnosis present

## 2024-07-06 DIAGNOSIS — Z7189 Other specified counseling: Secondary | ICD-10-CM | POA: Diagnosis not present

## 2024-07-06 DIAGNOSIS — Z66 Do not resuscitate: Secondary | ICD-10-CM | POA: Diagnosis present

## 2024-07-06 DIAGNOSIS — F03918 Unspecified dementia, unspecified severity, with other behavioral disturbance: Secondary | ICD-10-CM

## 2024-07-06 DIAGNOSIS — E872 Acidosis, unspecified: Secondary | ICD-10-CM | POA: Diagnosis present

## 2024-07-06 DIAGNOSIS — Z515 Encounter for palliative care: Secondary | ICD-10-CM | POA: Diagnosis not present

## 2024-07-06 DIAGNOSIS — Z558 Other problems related to education and literacy: Secondary | ICD-10-CM | POA: Diagnosis not present

## 2024-07-06 LAB — BASIC METABOLIC PANEL WITH GFR
Anion gap: 8 (ref 5–15)
BUN: 23 mg/dL (ref 8–23)
CO2: 24 mmol/L (ref 22–32)
Calcium: 8.1 mg/dL — ABNORMAL LOW (ref 8.9–10.3)
Chloride: 109 mmol/L (ref 98–111)
Creatinine, Ser: 0.7 mg/dL (ref 0.61–1.24)
GFR, Estimated: >60 mL/min
Glucose, Bld: 94 mg/dL (ref 70–99)
Potassium: 3.5 mmol/L (ref 3.5–5.1)
Sodium: 141 mmol/L (ref 135–145)

## 2024-07-06 LAB — CBC
HCT: 40.8 % (ref 39.0–52.0)
Hemoglobin: 13.5 g/dL (ref 13.0–17.0)
MCH: 33.7 pg (ref 26.0–34.0)
MCHC: 33.1 g/dL (ref 30.0–36.0)
MCV: 101.7 fL — ABNORMAL HIGH (ref 80.0–100.0)
Platelets: 216 K/uL (ref 150–400)
RBC: 4.01 MIL/uL — ABNORMAL LOW (ref 4.22–5.81)
RDW: 13.2 % (ref 11.5–15.5)
WBC: 5.8 K/uL (ref 4.0–10.5)
nRBC: 0 % (ref 0.0–0.2)

## 2024-07-06 LAB — CBG MONITORING, ED: Glucose-Capillary: 93 mg/dL (ref 70–99)

## 2024-07-06 MED ORDER — LORAZEPAM 2 MG/ML IJ SOLN
0.5000 mg | INTRAMUSCULAR | Status: DC | PRN
  Administered 2024-07-06 – 2024-07-07 (×4): 0.5 mg via INTRAVENOUS
  Filled 2024-07-06 (×4): qty 1

## 2024-07-06 MED ORDER — QUETIAPINE FUMARATE 25 MG PO TABS
50.0000 mg | ORAL_TABLET | Freq: Every day | ORAL | Status: DC
  Administered 2024-07-07 – 2024-07-10 (×3): 50 mg via ORAL
  Filled 2024-07-06 (×4): qty 2

## 2024-07-06 MED ORDER — QUETIAPINE FUMARATE 25 MG PO TABS
150.0000 mg | ORAL_TABLET | Freq: Every day | ORAL | Status: DC
  Administered 2024-07-06 – 2024-07-09 (×4): 150 mg via ORAL
  Filled 2024-07-06 (×4): qty 2

## 2024-07-06 MED ORDER — ZIPRASIDONE MESYLATE 20 MG IM SOLR: 10.0000 mg | Freq: Four times a day (QID) | INTRAMUSCULAR | Status: DC | PRN

## 2024-07-06 NOTE — Plan of Care (Signed)
   Problem: Education: Goal: Knowledge of General Education information will improve Description Including pain rating scale, medication(s)/side effects and non-pharmacologic comfort measures Outcome: Progressing

## 2024-07-06 NOTE — Assessment & Plan Note (Signed)
-   This is of unclear etiology. - There is no clear infectious reason. - Will be admitted to an observation medical telemetry bed. - We will place him on as needed Ativan  that helped and IM Geodon . - He did not respond much to IM Haldol . - Will follow neurochecks every 4 hours for 24 hours.

## 2024-07-06 NOTE — Progress Notes (Addendum)
 Transition of Care Department [facility]) has reviewed patient and no other TOC needs have been identified at this time. We will continue to monitor patient advancement through interdisciplinary progression rounds. If new patient transition needs arise, please place a TOC consult.   07/06/24 1047  TOC Brief Assessment  Insurance and Status Reviewed  Patient has primary care physician Yes  Home environment has been reviewed Lives with wife and daughter.  Prior level of function: Wife and aid (over 40 hours a week) assist.  Prior/Current Home Services No current home services  Social Drivers of Health Review SDOH reviewed no interventions necessary  Readmission risk has been reviewed Yes  Transition of care needs no transition of care needs at this time

## 2024-07-06 NOTE — ED Notes (Signed)
 Pt constantly restless now. Wife asking for something to calm him down.

## 2024-07-06 NOTE — Assessment & Plan Note (Signed)
-   Will continue Namenda  and Seroquel

## 2024-07-06 NOTE — ED Notes (Addendum)
 Pt too restless for vs at this time. Wife still at bedside.

## 2024-07-06 NOTE — TOC Progression Note (Signed)
 Transition of Care [facility]) - Progression Note    Patient Details  Name: Christopher Mccoy MRN: [id number] Date of Birth: 02-28-1946  Transition of Care [facility]) CM/SW Contact  Mcarthur Saddie Kim, [place] Phone Number: 07/06/2024, 8:17 AM  Clinical Narrative:  [facility] notification completed. Notification ID: [id number].                        Expected Discharge Plan and Services                                               Social Drivers of Health (SDOH) Interventions SDOH Screenings   Food Insecurity: No Food Insecurity (05/06/2024)  Housing: Low Risk  (05/06/2024)  Transportation Needs: No Transportation Needs (05/06/2024)  Utilities: Patient Unable To Answer (05/06/2024)  Social Connections: Unknown (05/06/2024)  Tobacco Use: High Risk (07/05/2024)    Readmission Risk Interventions    05/09/2024    9:09 AM 04/15/2024    2:53 PM  Readmission Risk Prevention Plan  Post Dischage Appt  Complete  Medication Screening  Complete  Transportation Screening Complete Complete  PCP or Specialist Appt within 5-7 Days Complete   Home Care Screening Complete   Medication Review (RN CM) Complete

## 2024-07-06 NOTE — ED Notes (Signed)
Pt sleeping nad.chest rise and fall noted

## 2024-07-06 NOTE — ED Notes (Signed)
 Pt still too restless for vs at this time

## 2024-07-06 NOTE — Progress Notes (Signed)
 PROGRESS NOTE    Christopher Mccoy  FMW:[id number] DOB: [birth year] DOA: 07/05/2024 [facility]CP[facility]   Brief Narrative:    Assessment & Plan:   Principal Problem:   Acute metabolic encephalopathy Active Problems:   Hypokalemia   Dehydration   Dementia with behavioral disturbance (HCC)   Anxiety and depression   78 year old male with history of Alzheimer's dementia, admitted with concern for increasing confusion.  Assessment and Plan: * Acute metabolic encephalopathy - This is of unclear etiology.  Could be from dehydration. - There is no clear infectious reason.  UA is negative.  Head CT is negative.  Blood culture was obtained in the ER and is in progress.  Metabolic panel is reassuring. - Patient received 2 doses of IV Ativan  in the ER along with trazodone  which likely is the cause of lethargy today.  He is able to wake up with the stimulus and becomes agitated. -Will utilize antipsychotics as needed.  Will hold Ativan .  Hypokalemia - Potassium will be replaced   Dementia with behavioral disturbance (HCC) - Will continue Namenda  and Seroquel    Dehydration - This is manifested by mild acidemia. - He will be hydrated with IV normal saline and will follow BMP.   Anxiety and depression Will resume Ativan  and Celexa .       DVT prophylaxis: Lovenox .  Advanced Care Planning:  Code Status: The patient is DNR and DNI.  He was discussed with his wife. Family Communication:  The plan of care was discussed in details with the patient (and family). I answered all questions. The patient agreed to proceed with the above mentioned plan. Further management will depend upon hospital course. Disposition Plan: Back to previous home environment vs placement pending clinical progress. Consults called: none.    Consultants:   Procedures:   Antimicrobials:    Subjective:   Objective: Vitals:   07/06/24 0000 07/06/24 0100 07/06/24 0255 07/06/24 0924  BP:   123/87  119/86  Pulse:   89 67  Resp: (!) 22 20 (!) 22 18  Temp:   (!) 97.3 F (36.3 C) (!) 97.5 F (36.4 C)  TempSrc:   Temporal Oral  SpO2:   96% 100%  Weight:      Height:        Intake/Output Summary (Last 24 hours) at 07/06/2024 1135 Last data filed at 07/06/2024 0219 Gross per 24 hour  Intake 100 ml  Output --  Net 100 ml   Filed Weights   07/05/24 1836  Weight: 79 kg    Examination:  General exam: Appears calm and comfortable  Respiratory system: Bilateral decreased breath sounds at bases Cardiovascular system: S1 & S2 heard, Rate controlled Gastrointestinal system: Abdomen is nondistended, soft and nontender. Normal bowel sounds heard. Extremities: No cyanosis, clubbing, edema  Central nervous system: Alert and oriented. No focal neurological deficits. Moving extremities Skin: No rashes, lesions or ulcers Psychiatry: Judgement and insight appear normal. Mood & affect appropriate.     Data Reviewed: I have personally reviewed following labs and imaging studies  CBC: Recent Labs  Lab 07/05/24 1931 07/06/24 0456  WBC 6.1 5.8  NEUTROABS 3.9  --   HGB 14.8 13.5  HCT 44.6 40.8  MCV 99.8 101.7*  PLT 243 216   Basic Metabolic Panel: Recent Labs  Lab 07/05/24 1931 07/06/24 0456  NA 142 141  K 3.2* 3.5  CL 106 109  CO2 24 24  GLUCOSE 116* 94  BUN 26* 23  CREATININE 0.69 0.70  CALCIUM  8.8* 8.1*  MG 2.2  --    GFR: Estimated Creatinine Clearance: 81.1 mL/min (by C-G formula based on SCr of 0.7 mg/dL). Liver Function Tests: Recent Labs  Lab 07/05/24 1931  AST 25  ALT 31  ALKPHOS 81  BILITOT 1.0  PROT 7.0  ALBUMIN 3.7   No results for input(s): LIPASE, AMYLASE in the last 168 hours. No results for input(s): AMMONIA in the last 168 hours. Coagulation Profile: No results for input(s): INR, PROTIME in the last 168 hours. Cardiac Enzymes: No results for input(s): CKTOTAL, CKMB, CKMBINDEX, TROPONINI in the last 168 hours. BNP (last 3  results) No results for input(s): PROBNP in the last 8760 hours. HbA1C: No results for input(s): HGBA1C in the last 72 hours. CBG: Recent Labs  Lab 07/06/24 1126  GLUCAP 93   Lipid Profile: No results for input(s): CHOL, HDL, LDLCALC, TRIG, CHOLHDL, LDLDIRECT in the last 72 hours. Thyroid  Function Tests: No results for input(s): TSH, T4TOTAL, FREET4, T3FREE, THYROIDAB in the last 72 hours. Anemia Panel: No results for input(s): VITAMINB12, FOLATE, FERRITIN, TIBC, IRON, RETICCTPCT in the last 72 hours. Sepsis Labs: Recent Labs  Lab 07/05/24 1931  LATICACIDVEN 1.3    No results found for this or any previous visit (from the past 240 hours).       Radiology Studies: DG Chest Port 1 View Result Date: 07/05/2024 CLINICAL DATA:  Weakness and altered mental status. EXAM: PORTABLE CHEST 1 VIEW COMPARISON:  04/13/2024 FINDINGS: The cardiomediastinal contours are stable. Chronic elevation of right hemidiaphragm. Pulmonary vasculature is normal. No consolidation, pleural effusion, or pneumothorax. No acute osseous abnormalities are seen. IMPRESSION: No active disease. Electronically Signed   By: Andrea Gasman M.D.   On: 07/05/2024 21:27   CT Head Wo Contrast Result Date: 07/05/2024 CLINICAL DATA:  Altered mental status EXAM: CT HEAD WITHOUT CONTRAST TECHNIQUE: Contiguous axial images were obtained from the base of the skull through the vertex without intravenous contrast. RADIATION DOSE REDUCTION: This exam was performed according to the departmental dose-optimization program which includes automated exposure control, adjustment of the mA and/or kV according to patient size and/or use of iterative reconstruction technique. COMPARISON:  05/06/2024 FINDINGS: Brain: No evidence of acute infarction, hemorrhage, hydrocephalus, extra-axial collection or mass lesion/mass effect. Chronic atrophic and white matter ischemic changes noted. Vascular: No hyperdense  vessel or unexpected calcification. Skull: Normal. Negative for fracture or focal lesion. Sinuses/Orbits: No acute finding. Other: None. IMPRESSION: Chronic changes without acute abnormality. No change from the prior exam. Electronically Signed   By: Oneil Devonshire M.D.   On: 07/05/2024 20:11        Scheduled Meds:  citalopram   20 mg Oral Daily   cyanocobalamin   1,000 mcg Oral Daily   enoxaparin  (LOVENOX ) injection  40 mg Subcutaneous Q24H   memantine   10 mg Oral BID   QUEtiapine   50 mg Oral Daily   And   QUEtiapine   150 mg Oral QHS   Continuous Infusions:  sodium chloride  125 mL/hr at 07/05/24 2243          Creighton Longley, MD [facility] 07/06/2024, 11:35 AM

## 2024-07-06 NOTE — ED Notes (Signed)
 Pt more alert but still mildly restless. Attempted water with straw and pt able to swallow. Tea given. No choking or coughing noted.

## 2024-07-06 NOTE — ED Notes (Signed)
 When pt wakes he tenses and is combative. Will not open eyes. DR Derryl aware. Vo NPO/hold meds at this time.

## 2024-07-06 NOTE — Progress Notes (Signed)
 Patient kicking at objects, and attempting to sit up in bed, restless at times. Bed alarm is on , and patients call bell in reach , fall mats per protocol. Given Prn ativan  0.5mg  yielding helpful results

## 2024-07-06 NOTE — TOC Initial Note (Signed)
 Transition of Care [facility]) - Initial/Assessment Note    Patient Details  Name: Christopher Mccoy MRN: [id number] Date of Birth: Dec 25, 1945  Transition [facility]) CM/SW Contact:    Mcarthur Saddie Kim, LCSW Phone Number: 07/06/2024, 11:32 AM  Clinical Narrative:  Pt admitted for acute metabolic encephalopathy. Assessment completed due to high risk readmission score. Pt lives with his wife and daughter. He has an aid through the [place] for over 40 hours a week. No current home health services, but was recently with [facility]. TOC will follow.                  Expected Discharge Plan: Home/Self Care Barriers to Discharge: Continued Medical Work up   Patient Goals and CMS Choice Patient states their goals for this hospitalization and ongoing recovery are:: return home   Choice offered to / list presented to : Spouse [place] ownership interest in [facility].provided to::  (n/a)    Expected Discharge Plan and Services In-house Referral: Clinical Social Work     Living arrangements for the past 2 months: Single Family Home                                      Prior Living Arrangements/Services Living arrangements for the past 2 months: Single Family Home Lives with:: Spouse, Adult Children Patient language and need for interpreter reviewed:: Yes Do you feel safe going back to the place where you live?: Yes      Need for Family Participation in Patient Care: Yes (Comment) Care giver support system in place?: Yes (comment)   Criminal Activity/Legal Involvement Pertinent to Current Situation/Hospitalization: No - Comment as needed  Activities of Daily Living      Permission Sought/Granted                  Emotional Assessment         Alcohol  / Substance Use: Not Applicable Psych Involvement: No (comment)  Admission diagnosis:  Acute metabolic encephalopathy [G93.41] Patient Active Problem List   Diagnosis Date Noted   Hypokalemia 07/06/2024    Dehydration 07/06/2024   Dementia with behavioral disturbance (HCC) 07/06/2024   Anxiety and depression 07/06/2024   Sepsis secondary to UTI (HCC) 05/07/2024   Acute metabolic encephalopathy 05/06/2024   UTI (urinary tract infection) 04/14/2024   Hyperlipidemia 04/14/2024   Atrial bigeminy 09/19/2021   Nonspecific abnormal electrocardiogram (ECG) (EKG) 09/19/2021   Arm pain 09/19/2021   Dementia (HCC) 09/19/2021   PCP:  [facility]:   [facility] - [place], [place] - [address] [place] [address] Phone: [phone] Fax: [phone]     Social Drivers of Health (SDOH) Social History: SDOH Screenings   Food Insecurity: No Food Insecurity (05/06/2024)  Housing: Low Risk  (05/06/2024)  Transportation Needs: No Transportation Needs (05/06/2024)  Utilities: Patient Unable To Answer (05/06/2024)  Social Connections: Unknown (05/06/2024)  Tobacco Use: High Risk (07/05/2024)   SDOH Interventions:     Readmission Risk Interventions    07/06/2024   11:31 AM 05/09/2024    9:09 AM 04/15/2024    2:53 PM  Readmission Risk Prevention Plan  Post Dischage Appt   Complete  Medication Screening   Complete  Transportation Screening Complete Complete Complete  PCP or Specialist Appt within 5-7 Days  Complete   Home Care Screening  Complete   Medication Review (  RN CM)  Complete   HRI or Home Care Consult Complete    Social Work Consult for Recovery Care Planning/Counseling Complete    Palliative Care Screening Not Applicable    Medication Review [employer]) Complete

## 2024-07-06 NOTE — Assessment & Plan Note (Signed)
 repleted   Serial BMP

## 2024-07-06 NOTE — Assessment & Plan Note (Signed)
 Will resume Ativan  and Celexa .

## 2024-07-06 NOTE — ED Notes (Signed)
Pupils perrla

## 2024-07-06 NOTE — Assessment & Plan Note (Signed)
-   This is manifested by mild acidemia. - He will be hydrated with IV normal saline and will follow BMP.

## 2024-07-07 DIAGNOSIS — G9341 Metabolic encephalopathy: Secondary | ICD-10-CM | POA: Diagnosis not present

## 2024-07-07 MED ORDER — OLANZAPINE 10 MG IM SOLR
5.0000 mg | Freq: Two times a day (BID) | INTRAMUSCULAR | Status: DC | PRN
  Administered 2024-07-07 – 2024-07-09 (×3): 5 mg via INTRAMUSCULAR
  Filled 2024-07-07 (×4): qty 10

## 2024-07-07 NOTE — Progress Notes (Signed)
 PROGRESS NOTE    Christopher Mccoy  FMW:[id number] DOB: 1945-12-15 DOA: 07/05/2024 PCP: [facility]   Brief Narrative:    Assessment & Plan:   Principal Problem:   Acute metabolic encephalopathy Active Problems:   Hypokalemia   Dehydration   Dementia with behavioral disturbance (HCC)   Anxiety and depression   78 year old male with history of Alzheimer's dementia, admitted with concern for increasing confusion.  Assessment and Plan: * Acute metabolic encephalopathy in the setting of underlying dementia - This is of unclear etiology.  Possible dehydration but this has been corrected already. - There is no clear infectious reason.  UA is negative.  Head CT is negative.  Blood culture was obtained in the ER is negative so far, metabolic panel is reassuring. -Suspect IV Ativan  might have worsened his situation. -Will utilize home dose of Seroquel  50 in the a.m. and 150 nightly, utilize Zyprexa  as needed to control agitation.   - I do not suspect meningoencephalitis or a stroke.  At this time LP or MRI of brain will be extremely challenging.  Wife agreeable.  Hypokalemia - Potassium will be replaced   Dementia with behavioral disturbance (HCC) - Will hold Namenda  and Aricept , continue Seroquel   Dehydration - This is manifested by mild acidemia. - He will be hydrated with IV normal saline and will follow BMP.   Anxiety and depression Continue Celexa , hold Ativan     DVT prophylaxis: Lovenox .  Advanced Care Planning:  Code Status: The patient is DNR and DNI.  He was discussed with his wife. Family Communication:  The plan of care was discussed in details with the patient (and family). I answered all questions. The patient agreed to proceed with the above mentioned plan. Further management will depend upon hospital course. Disposition Plan: Back to previous home environment vs placement pending clinical progress. Consults called: none.    Consultants:    Procedures:   Antimicrobials:    Subjective:  Patient seen and examined at the bedside.  He has been having intermittent agitation and received IV Ativan  last night.  Has mittens both hands and is seen kicking /punching in the air.  Does not follow commands. Objective: Vitals:   07/06/24 2057 07/07/24 0100 07/07/24 0500 07/07/24 1259  BP: 120/65 (!) 93/56 117/72 102/88  Pulse: (!) 52 (!) 59 (!) 53 82  Resp:      Temp: 97.8 F (36.6 C) 97.6 F (36.4 C) (!) 97.2 F (36.2 C)   TempSrc: Oral Axillary Axillary   SpO2: 93%     Weight:      Height:        Intake/Output Summary (Last 24 hours) at 07/07/2024 1332 Last data filed at 07/07/2024 0900 Gross per 24 hour  Intake 1120 ml  Output 200 ml  Net 920 ml   Filed Weights   07/05/24 1836 07/06/24 1706  Weight: 79 kg 73.2 kg    Examination:  General exam: Lethargic, does not follow commands, has mittens on both hands, seen punching and kicking in the air  respiratory system: Bilateral decreased breath sounds at bases Cardiovascular system: S1 & S2 heard, Rate controlled Gastrointestinal system: Abdomen is nondistended, soft and nontender. Normal bowel sounds heard. Extremities: No cyanosis, clubbing, edema  Central nervous system: Lethargic, does not follow commands, moves all 4 extremities.   Skin: No rashes, lesions or ulcers   Data Reviewed: I have personally reviewed following labs and imaging studies  CBC: Recent Labs  Lab 07/05/24 1931 07/06/24 0456  WBC  6.1 5.8  NEUTROABS 3.9  --   HGB 14.8 13.5  HCT 44.6 40.8  MCV 99.8 101.7*  PLT 243 216   Basic Metabolic Panel: Recent Labs  Lab 07/05/24 1931 07/06/24 0456  NA 142 141  K 3.2* 3.5  CL 106 109  CO2 24 24  GLUCOSE 116* 94  BUN 26* 23  CREATININE 0.69 0.70  CALCIUM  8.8* 8.1*  MG 2.2  --    GFR: Estimated Creatinine Clearance: 78.6 mL/min (by C-G formula based on SCr of 0.7 mg/dL). Liver Function Tests: Recent Labs  Lab 07/05/24 1931  AST  25  ALT 31  ALKPHOS 81  BILITOT 1.0  PROT 7.0  ALBUMIN 3.7   No results for input(s): LIPASE, AMYLASE in the last 168 hours. No results for input(s): AMMONIA in the last 168 hours. Coagulation Profile: No results for input(s): INR, PROTIME in the last 168 hours. Cardiac Enzymes: No results for input(s): CKTOTAL, CKMB, CKMBINDEX, TROPONINI in the last 168 hours. BNP (last 3 results) No results for input(s): PROBNP in the last 8760 hours. HbA1C: No results for input(s): HGBA1C in the last 72 hours. CBG: Recent Labs  Lab 07/06/24 1126  GLUCAP 93   Lipid Profile: No results for input(s): CHOL, HDL, LDLCALC, TRIG, CHOLHDL, LDLDIRECT in the last 72 hours. Thyroid  Function Tests: No results for input(s): TSH, T4TOTAL, FREET4, T3FREE, THYROIDAB in the last 72 hours. Anemia Panel: No results for input(s): VITAMINB12, FOLATE, FERRITIN, TIBC, IRON, RETICCTPCT in the last 72 hours. Sepsis Labs: Recent Labs  Lab 07/05/24 1931  LATICACIDVEN 1.3    Recent Results (from the past 240 hours)  Culture, blood (routine x 2)     Status: None (Preliminary result)   Collection Time: 07/05/24  7:31 PM   Specimen: BLOOD  Result Value Ref Range Status   Specimen Description BLOOD BLOOD LEFT ARM  Final   Special Requests   Final    BOTTLES DRAWN AEROBIC AND ANAEROBIC Blood Culture results may not be optimal due to an inadequate volume of blood received in culture bottles   Culture   Final    NO GROWTH 2 DAYS Performed at [facility], [address], [place], [place] [address]    Report Status PENDING  Incomplete  Culture, blood (routine x 2)     Status: None (Preliminary result)   Collection Time: 07/05/24  7:31 PM   Specimen: BLOOD  Result Value Ref Range Status   Specimen Description BLOOD BLOOD LEFT HAND  Final   Special Requests   Final    AEROBIC BOTTLE ONLY Blood Culture results may not be optimal due to an inadequate volume of  blood received in culture bottles   Culture   Final    NO GROWTH 2 DAYS Performed at [facility], [address] [place], [place] [address]    Report Status PENDING  Incomplete         Radiology Studies: DG Chest Port 1 View Result Date: 07/05/2024 CLINICAL DATA:  Weakness and altered mental status. EXAM: PORTABLE CHEST 1 VIEW COMPARISON:  04/13/2024 FINDINGS: The cardiomediastinal contours are stable. Chronic elevation of right hemidiaphragm. Pulmonary vasculature is normal. No consolidation, pleural effusion, or pneumothorax. No acute osseous abnormalities are seen. IMPRESSION: No active disease. Electronically Signed   By: Andrea Gasman M.D.   On: 07/05/2024 21:27   CT Head Wo Contrast Result Date: 07/05/2024 CLINICAL DATA:  Altered mental status EXAM: CT HEAD WITHOUT CONTRAST TECHNIQUE: Contiguous axial images were obtained from the  base of the skull through the vertex without intravenous contrast. RADIATION DOSE REDUCTION: This exam was performed according to the departmental dose-optimization program which includes automated exposure control, adjustment of the mA and/or kV according to patient size and/or use of iterative reconstruction technique. COMPARISON:  05/06/2024 FINDINGS: Brain: No evidence of acute infarction, hemorrhage, hydrocephalus, extra-axial collection or mass lesion/mass effect. Chronic atrophic and white matter ischemic changes noted. Vascular: No hyperdense vessel or unexpected calcification. Skull: Normal. Negative for fracture or focal lesion. Sinuses/Orbits: No acute finding. Other: None. IMPRESSION: Chronic changes without acute abnormality. No change from the prior exam. Electronically Signed   By: Oneil Devonshire M.D.   On: 07/05/2024 20:11        Scheduled Meds:  citalopram   20 mg Oral Daily   cyanocobalamin   1,000 mcg Oral Daily   enoxaparin  (LOVENOX ) injection  40 mg Subcutaneous Q24H   memantine   10 mg Oral BID   QUEtiapine   50 mg Oral Daily   And    QUEtiapine   150 mg Oral QHS   Continuous Infusions:          Kemauri Musa, MD [facility] 07/07/2024, 1:32 PM

## 2024-07-07 NOTE — Plan of Care (Signed)

## 2024-07-07 NOTE — Plan of Care (Signed)
   Problem: Education: Goal: Knowledge of General Education information will improve Description Including pain rating scale, medication(s)/side effects and non-pharmacologic comfort measures Outcome: Progressing   Problem: Health Behavior/Discharge Planning: Goal: Ability to manage health-related needs will improve Outcome: Progressing

## 2024-07-08 DIAGNOSIS — G9341 Metabolic encephalopathy: Secondary | ICD-10-CM | POA: Diagnosis not present

## 2024-07-08 MED ORDER — LORAZEPAM BOLUS VIA INFUSION
0.5000 mg | Freq: Once | INTRAVENOUS | Status: DC
  Filled 2024-07-08: qty 1

## 2024-07-08 NOTE — Plan of Care (Signed)
  Problem: Clinical Measurements: Goal: Will remain free from infection Outcome: Progressing Goal: Diagnostic test results will improve Outcome: Progressing   Problem: Elimination: Goal: Will not experience complications related to bowel motility Outcome: Progressing Goal: Will not experience complications related to urinary retention Outcome: Progressing   Problem: Safety: Goal: Ability to remain free from injury will improve Outcome: Progressing   Problem: Education: Goal: Knowledge of General Education information will improve Description: Including pain rating scale, medication(s)/side effects and non-pharmacologic comfort measures Outcome: Not Progressing   Problem: Health Behavior/Discharge Planning: Goal: Ability to manage health-related needs will improve Outcome: Not Progressing   Problem: Clinical Measurements: Goal: Ability to maintain clinical measurements within normal limits will improve Outcome: Not Progressing Goal: Respiratory complications will improve Outcome: Not Progressing Goal: Cardiovascular complication will be avoided Outcome: Not Progressing   Problem: Activity: Goal: Risk for activity intolerance will decrease Outcome: Not Progressing   Problem: Nutrition: Goal: Adequate nutrition will be maintained Outcome: Not Progressing   Problem: Coping: Goal: Level of anxiety will decrease Outcome: Not Progressing   Problem: Pain Managment: Goal: General experience of comfort will improve and/or be controlled Outcome: Not Progressing   Problem: Skin Integrity: Goal: Risk for impaired skin integrity will decrease Outcome: Not Progressing

## 2024-07-08 NOTE — Plan of Care (Signed)
  Problem: Health Behavior/Discharge Planning: Goal: Ability to manage health-related needs will improve Outcome: Progressing   Problem: Clinical Measurements: Goal: Ability to maintain clinical measurements within normal limits will improve Outcome: Progressing   Problem: Clinical Measurements: Goal: Will remain free from infection Outcome: Progressing   Problem: Clinical Measurements: Goal: Diagnostic test results will improve Outcome: Progressing   Problem: Clinical Measurements: Goal: Respiratory complications will improve Outcome: Progressing   Problem: Clinical Measurements: Goal: Cardiovascular complication will be avoided Outcome: Progressing   Problem: Nutrition: Goal: Adequate nutrition will be maintained Outcome: Progressing   Problem: Coping: Goal: Level of anxiety will decrease Outcome: Progressing   Problem: Pain Managment: Goal: General experience of comfort will improve and/or be controlled Outcome: Progressing   Problem: Safety: Goal: Ability to remain free from injury will improve Outcome: Progressing   Problem: Skin Integrity: Goal: Risk for impaired skin integrity will decrease Outcome: Progressing

## 2024-07-08 NOTE — Progress Notes (Signed)
 Pt is increasingly restless, rocking in bed, banging head onto mattress, kicking feet over side rail. No improvement in behavior since IM Zyprexa  administered. Remains in mitts, unable to be redirected, yelling out. Seizure pads added to left lower siderail earlier this afternoon to protect pt's legs and feet. Sitter has been able to get pt to take a few sips of liquids but pt has not eaten anything today or had any oral meds. MD Sigdel notified of current behavior.

## 2024-07-08 NOTE — Progress Notes (Signed)
 Pt alert with confusion x 4. Very restless early in shift. Went to sleep after receiving HS seroquel  woke up about 3 hrs later. Continues to need verbal redirection so safety

## 2024-07-08 NOTE — Progress Notes (Signed)
 PROGRESS NOTE    Christopher Mccoy  FMW:[id number] DOB: Apr 19, 1946 DOA: 07/05/2024 PCP: [facility]   Brief Narrative:    Assessment & Plan:   Principal Problem:   Acute metabolic encephalopathy Active Problems:   Hypokalemia   Dehydration   Dementia with behavioral disturbance (HCC)   Anxiety and depression   78 year old male with history of Alzheimer's dementia, admitted with concern for increasing confusion.  Assessment and Plan: * Acute metabolic encephalopathy in the setting of underlying dementia Now complicated by delirium - This is of unclear etiology.  Possible dehydration but this has been corrected already. - There is no clear infectious reason.  UA is negative.  Head CT is negative.  Blood culture was obtained in the ER is negative so far, metabolic panel is reassuring. -Suspect IV Ativan  might have worsened his situation. -Will utilize home dose of Seroquel  50 in the a.m. and 150 nightly, utilize Zyprexa  as needed to control agitation.   - I do not suspect meningoencephalitis or a stroke.  At this time LP or MRI of brain will be extremely challenging.  Wife agreeable. - Will minimize sedatives as much as possible.  Hypokalemia - Potassium  replaced   Dementia with behavioral disturbance (HCC) - Will hold Namenda  and Aricept , continue Seroquel   Dehydration - This is manifested by mild acidemia. - He will be hydrated with IV normal saline and will follow BMP.   Anxiety and depression Continue Celexa , hold Ativan     DVT prophylaxis: Lovenox .  Advanced Care Planning:  Code Status: The patient is DNR and DNI.  He was discussed with his wife. Family Communication:  The plan of care was discussed in details with the patient (and family). I answered all questions. The patient agreed to proceed with the above mentioned plan. Further management will depend upon hospital course. Disposition Plan: Back to previous home environment vs placement pending  clinical progress. Consults called: none.    Consultants:   Procedures:   Antimicrobials:    Subjective:  Patient seen and examined at the bedside.  He was sleeping on my visit.  Tried to open his eyes and moaned with tactile stimulus.  He has been restless throughout the night.  Per wife, he slept at around 4 AM.    Objective: Vitals:   07/07/24 0500 07/07/24 1259 07/07/24 2003 07/08/24 0424  BP: 117/72 102/88 (!) 140/98 116/81  Pulse: (!) 53 82 85 84  Resp:      Temp: (!) 97.2 F (36.2 C)  98.5 F (36.9 C) 98.3 F (36.8 C)  TempSrc: Axillary  Axillary Axillary  SpO2:   94% 95%  Weight:      Height:        Intake/Output Summary (Last 24 hours) at 07/08/2024 1137 Last data filed at 07/08/2024 0426 Gross per 24 hour  Intake 120 ml  Output 1200 ml  Net -1080 ml   Filed Weights   07/05/24 1836 07/06/24 1706  Weight: 79 kg 73.2 kg    Examination:  General exam: Lethargic, does not follow commands, has mittens on both hands,  respiratory system: Bilateral decreased breath sounds at bases Cardiovascular system: S1 & S2 heard, Rate controlled Gastrointestinal system: Abdomen is nondistended, soft and nontender. Normal bowel sounds heard. Extremities: No cyanosis, clubbing, edema  Central nervous system: Lethargic, does not follow commands, moves all 4 extremities.   Skin: No rashes, lesions or ulcers   Data Reviewed: I have personally reviewed following labs and imaging studies  CBC: Recent  Labs  Lab 07/05/24 1931 07/06/24 0456  WBC 6.1 5.8  NEUTROABS 3.9  --   HGB 14.8 13.5  HCT 44.6 40.8  MCV 99.8 101.7*  PLT 243 216   Basic Metabolic Panel: Recent Labs  Lab 07/05/24 1931 07/06/24 0456  NA 142 141  K 3.2* 3.5  CL 106 109  CO2 24 24  GLUCOSE 116* 94  BUN 26* 23  CREATININE 0.69 0.70  CALCIUM  8.8* 8.1*  MG 2.2  --    GFR: Estimated Creatinine Clearance: 78.6 mL/min (by C-G formula based on SCr of 0.7 mg/dL). Liver Function Tests: Recent  Labs  Lab 07/05/24 1931  AST 25  ALT 31  ALKPHOS 81  BILITOT 1.0  PROT 7.0  ALBUMIN 3.7   No results for input(s): LIPASE, AMYLASE in the last 168 hours. No results for input(s): AMMONIA in the last 168 hours. Coagulation Profile: No results for input(s): INR, PROTIME in the last 168 hours. Cardiac Enzymes: No results for input(s): CKTOTAL, CKMB, CKMBINDEX, TROPONINI in the last 168 hours. BNP (last 3 results) No results for input(s): PROBNP in the last 8760 hours. HbA1C: No results for input(s): HGBA1C in the last 72 hours. CBG: Recent Labs  Lab 07/06/24 1126  GLUCAP 93   Lipid Profile: No results for input(s): CHOL, HDL, LDLCALC, TRIG, CHOLHDL, LDLDIRECT in the last 72 hours. Thyroid  Function Tests: No results for input(s): TSH, T4TOTAL, FREET4, T3FREE, THYROIDAB in the last 72 hours. Anemia Panel: No results for input(s): VITAMINB12, FOLATE, FERRITIN, TIBC, IRON, RETICCTPCT in the last 72 hours. Sepsis Labs: Recent Labs  Lab 07/05/24 1931  LATICACIDVEN 1.3    Recent Results (from the past 240 hours)  Culture, blood (routine x 2)     Status: None (Preliminary result)   Collection Time: 07/05/24  7:31 PM   Specimen: BLOOD  Result Value Ref Range Status   Specimen Description BLOOD BLOOD LEFT ARM  Final   Special Requests   Final    BOTTLES DRAWN AEROBIC AND ANAEROBIC Blood Culture results may not be optimal due to an inadequate volume of blood received in culture bottles   Culture   Final    NO GROWTH 3 DAYS Performed at [facility], [address] [place], [place] [address]    Report Status PENDING  Incomplete  Culture, blood (routine x 2)     Status: None (Preliminary result)   Collection Time: 07/05/24  7:31 PM   Specimen: BLOOD  Result Value Ref Range Status   Specimen Description BLOOD BLOOD LEFT HAND  Final   Special Requests   Final    AEROBIC BOTTLE ONLY Blood Culture results may not be optimal  due to an inadequate volume of blood received in culture bottles   Culture   Final    NO GROWTH 3 DAYS Performed at [facility], [address], [place], [place] [address]    Report Status PENDING  Incomplete         Radiology Studies: No results found.       Scheduled Meds:  citalopram   20 mg Oral Daily   cyanocobalamin   1,000 mcg Oral Daily   enoxaparin  (LOVENOX ) injection  40 mg Subcutaneous Q24H   memantine   10 mg Oral BID   QUEtiapine   50 mg Oral Daily   And   QUEtiapine   150 mg Oral QHS   Continuous Infusions:          Sapna Padron, MD [facility] 07/08/2024, 11:37 AM

## 2024-07-09 DIAGNOSIS — G9341 Metabolic encephalopathy: Principal | ICD-10-CM

## 2024-07-09 MED ORDER — LORAZEPAM 2 MG/ML IJ SOLN: 0.5000 mg | Freq: Once | INTRAMUSCULAR | Status: DC

## 2024-07-09 NOTE — Progress Notes (Signed)
 Pt with episodes of yelling and screaming, swinging arms and rocking in bed, then will lie still for 3-4 minutes then begin the restless/agitated behaviours again. Was able to get pt to take his am meds with ice cream and wife was able to feed pt entire bowl of ice cream, oatmeal and pt drank 1 cup apple juice and water. Still does not follow commands or recognize anyone.

## 2024-07-09 NOTE — Plan of Care (Signed)

## 2024-07-09 NOTE — Progress Notes (Signed)
 PROGRESS NOTE    Christopher Mccoy  FMW:[id number] DOB: [birth year] DOA: 07/05/2024 PCP: [facility]   Brief Narrative:  78 year old male with history of Alzheimer's dementia was admitted with concern for increasing confusion.  Assessment & Plan:   Acute metabolic encephalopathy in the setting of underlying dementia complicated by delirium Dementia with behavioral disturbance Anxiety and depression Goals of care - Unclear etiology.  Possibly due to worsening dementia.  Dehydration might be a contributing factor but this has been already corrected with IV fluids. -No infectious etiology with negative UA.  CT of the head negative for acute abnormality.  Blood culture negative so far - Check ammonia, B12, TSH, folate in AM.  Other metabolic panel unremarkable -Continue home doses of Seroquel : Might have to increase a.m. dose as well.  Continue Zyprexa  as needed. -do not suspect meningoencephalitis or a stroke. At this time LP or MRI of brain will be extremely challenging and doubt that these are needed. Wife agreeable with the same.  - Try and avoid IV Ativan  as much as possible - Consult psychiatry. - Consult palliative care for goals of care discussion.  Patient might need placement in a memory care unit or Geri psych facility: Wife currently not agreeable for the same. - Fall precautions.  PT eval. - Continue citalopram  and memantine   Dehydration - Treated with IV fluids and subsequently discontinued  DVT prophylaxis: Lovenox  Code Status: DNR Family Communication: Wife at bedside Disposition Plan: Status is: Inpatient Remains inpatient appropriate because: Of severe illness  Consultants: Consult palliative care and psychiatry  Procedures: None  Antimicrobials: None   Subjective: Patient seen and examined at bedside.  Poor historian.  Patient was agitated overnight as per nursing staff.  No fever, vomiting, seizures reported.  Objective: Vitals:   07/08/24 0424  07/08/24 1459 07/08/24 2333 07/09/24 0510  BP: 116/81 95/66 120/67 (!) 154/84  Pulse: 84 72 91 73  Resp:   20   Temp: 98.3 F (36.8 C) 97.7 F (36.5 C) 98 F (36.7 C) 97.7 F (36.5 C)  TempSrc: Axillary  Oral Axillary  SpO2: 95% 96% 97% 97%  Weight:      Height:        Intake/Output Summary (Last 24 hours) at 07/09/2024 1056 Last data filed at 07/09/2024 0511 Gross per 24 hour  Intake 300 ml  Output 850 ml  Net -550 ml   Filed Weights   07/05/24 1836 07/06/24 1706  Weight: 79 kg 73.2 kg    Examination:  General exam: Appears calm and comfortable.  Looks chronically ill and deconditioned. Respiratory system: Bilateral decreased breath sounds at bases with scattered crackles Cardiovascular system: S1 & S2 heard, Rate controlled Gastrointestinal system: Abdomen is nondistended, soft and nontender. Normal bowel sounds heard. Extremities: No cyanosis, clubbing, edema  Central nervous system: Awake, slow to respond, poor historian.  Confused.  No focal neurological deficits. Moving extremities.  Trying to get out of bed. Skin: No rashes, lesions or ulcers Psychiatry: Flat affect.  Not agitated.  Data Reviewed: I have personally reviewed following labs and imaging studies  CBC: Recent Labs  Lab 07/05/24 1931 07/06/24 0456  WBC 6.1 5.8  NEUTROABS 3.9  --   HGB 14.8 13.5  HCT 44.6 40.8  MCV 99.8 101.7*  PLT 243 216   Basic Metabolic Panel: Recent Labs  Lab 07/05/24 1931 07/06/24 0456  NA 142 141  K 3.2* 3.5  CL 106 109  CO2 24 24  GLUCOSE 116* 94  BUN 26*  23  CREATININE 0.69 0.70  CALCIUM  8.8* 8.1*  MG 2.2  --    GFR: Estimated Creatinine Clearance: 78.6 mL/min (by C-G formula based on SCr of 0.7 mg/dL). Liver Function Tests: Recent Labs  Lab 07/05/24 1931  AST 25  ALT 31  ALKPHOS 81  BILITOT 1.0  PROT 7.0  ALBUMIN 3.7   No results for input(s): LIPASE, AMYLASE in the last 168 hours. No results for input(s): AMMONIA in the last 168  hours. Coagulation Profile: No results for input(s): INR, PROTIME in the last 168 hours. Cardiac Enzymes: No results for input(s): CKTOTAL, CKMB, CKMBINDEX, TROPONINI in the last 168 hours. BNP (last 3 results) No results for input(s): PROBNP in the last 8760 hours. HbA1C: No results for input(s): HGBA1C in the last 72 hours. CBG: Recent Labs  Lab 07/06/24 1126  GLUCAP 93   Lipid Profile: No results for input(s): CHOL, HDL, LDLCALC, TRIG, CHOLHDL, LDLDIRECT in the last 72 hours. Thyroid  Function Tests: No results for input(s): TSH, T4TOTAL, FREET4, T3FREE, THYROIDAB in the last 72 hours. Anemia Panel: No results for input(s): VITAMINB12, FOLATE, FERRITIN, TIBC, IRON, RETICCTPCT in the last 72 hours. Sepsis Labs: Recent Labs  Lab 07/05/24 1931  LATICACIDVEN 1.3    Recent Results (from the past 240 hours)  Culture, blood (routine x 2)     Status: None (Preliminary result)   Collection Time: 07/05/24  7:31 PM   Specimen: BLOOD  Result Value Ref Range Status   Specimen Description BLOOD BLOOD LEFT ARM  Final   Special Requests   Final    BOTTLES DRAWN AEROBIC AND ANAEROBIC Blood Culture results may not be optimal due to an inadequate volume of blood received in culture bottles   Culture   Final    NO GROWTH 4 DAYS Performed at [facility], [address], [place], [place] [address]    Report Status PENDING  Incomplete  Culture, blood (routine x 2)     Status: None (Preliminary result)   Collection Time: 07/05/24  7:31 PM   Specimen: BLOOD  Result Value Ref Range Status   Specimen Description BLOOD BLOOD LEFT HAND  Final   Special Requests   Final    AEROBIC BOTTLE ONLY Blood Culture results may not be optimal due to an inadequate volume of blood received in culture bottles   Culture   Final    NO GROWTH 4 DAYS Performed at [facility], [address], [place], [place] [address]    Report Status PENDING  Incomplete          Radiology Studies: No results found.      Scheduled Meds:  citalopram   20 mg Oral Daily   cyanocobalamin   1,000 mcg Oral Daily   enoxaparin  (LOVENOX ) injection  40 mg Subcutaneous Q24H   LORazepam   0.5 mg Intravenous Once   memantine   10 mg Oral BID   QUEtiapine   50 mg Oral Daily   And   QUEtiapine   150 mg Oral QHS   Continuous Infusions:        Sophie Mao, MD [facility] 07/09/2024, 10:56 AM

## 2024-07-10 DIAGNOSIS — G9341 Metabolic encephalopathy: Secondary | ICD-10-CM | POA: Diagnosis not present

## 2024-07-10 LAB — CULTURE, BLOOD (ROUTINE X 2)
Culture: NO GROWTH
Culture: NO GROWTH

## 2024-07-10 MED ORDER — QUETIAPINE FUMARATE 25 MG PO TABS
150.0000 mg | ORAL_TABLET | Freq: Every day | ORAL | Status: DC
  Administered 2024-07-10 – 2024-07-11 (×2): 150 mg via ORAL
  Filled 2024-07-10 (×2): qty 2

## 2024-07-10 MED ORDER — POLYVINYL ALCOHOL 1.4 % OP SOLN
1.0000 [drp] | OPHTHALMIC | Status: DC | PRN
  Filled 2024-07-10: qty 15

## 2024-07-10 MED ORDER — QUETIAPINE FUMARATE 25 MG PO TABS
50.0000 mg | ORAL_TABLET | Freq: Once | ORAL | Status: DC
  Filled 2024-07-10: qty 2

## 2024-07-10 MED ORDER — QUETIAPINE FUMARATE 100 MG PO TABS
100.0000 mg | ORAL_TABLET | Freq: Every day | ORAL | Status: DC
  Administered 2024-07-11 – 2024-07-12 (×2): 100 mg via ORAL
  Filled 2024-07-10 (×2): qty 1

## 2024-07-10 NOTE — Plan of Care (Signed)
  Problem: Clinical Measurements: Goal: Ability to maintain clinical measurements within normal limits will improve Outcome: Progressing Goal: Will remain free from infection Outcome: Progressing Goal: Diagnostic test results will improve Outcome: Progressing Goal: Respiratory complications will improve Outcome: Progressing Goal: Cardiovascular complication will be avoided Outcome: Progressing   Problem: Elimination: Goal: Will not experience complications related to bowel motility Outcome: Progressing Goal: Will not experience complications related to urinary retention Outcome: Progressing   Problem: Pain Managment: Goal: General experience of comfort will improve and/or be controlled Outcome: Progressing   Problem: Safety: Goal: Ability to remain free from injury will improve Outcome: Progressing   Problem: Skin Integrity: Goal: Risk for impaired skin integrity will decrease Outcome: Progressing   Problem: Education: Goal: Knowledge of General Education information will improve Description: Including pain rating scale, medication(s)/side effects and non-pharmacologic comfort measures Outcome: Not Progressing   Problem: Health Behavior/Discharge Planning: Goal: Ability to manage health-related needs will improve Outcome: Not Progressing   Problem: Activity: Goal: Risk for activity intolerance will decrease Outcome: Not Progressing   Problem: Nutrition: Goal: Adequate nutrition will be maintained Outcome: Not Progressing   Problem: Coping: Goal: Level of anxiety will decrease Outcome: Not Progressing

## 2024-07-10 NOTE — Progress Notes (Signed)
 PROGRESS NOTE    Christopher Mccoy  FMW:[id number] DOB: Feb 17, 1946 DOA: 07/05/2024 [facility]: [facility]   Brief Narrative:  78 year old male with history of Alzheimer's dementia was admitted with concern for increasing confusion.  Assessment & Plan:   Acute metabolic encephalopathy in the setting of underlying dementia complicated by delirium Dementia with behavioral disturbance Anxiety and depression Goals of care - Unclear etiology.  Possibly due to worsening dementia.  Dehydration might be a contributing factor but this has been already corrected with IV fluids. -No infectious etiology with negative UA.  CT of the head negative for acute abnormality.  Blood culture negative so far - Check ammonia, B12, TSH, folate in AM.  Other metabolic panel unremarkable - Currently on Seroquel .  Might have to increase a.m. dose as well.  Continues to require as needed Zyprexa . -do not suspect meningoencephalitis or a stroke. At this time LP or MRI of brain will be extremely challenging and doubt that these are needed. Wife agreeable with the same.  - Try and avoid IV Ativan  as much as possible - Consult psychiatry. - Consult palliative care for goals of care discussion.  Patient might need placement in a memory care unit or Geri psych facility: Wife currently not agreeable for the same. - Fall precautions.  PT eval. - Continue citalopram  and memantine   Dehydration - Treated with IV fluids and subsequently discontinued  DVT prophylaxis: Lovenox  Code Status: DNR Family Communication: Wife at bedside Disposition Plan: Status is: Inpatient Remains inpatient appropriate because: Of severe illness  Consultants: palliative care and psychiatry consultation pending  Procedures: None  Antimicrobials: None   Subjective: Patient seen and examined at bedside.  Poor historian.  Patient required Zyprexa  last night for agitation.  No fever, seizures or vomiting reported. Objective: Vitals:    07/08/24 2333 07/09/24 0510 07/09/24 1927 07/10/24 0536  BP: 120/67 (!) 154/84 119/69 100/64  Pulse: 91 73 (!) 56 84  Resp: 20  14 16   Temp: 98 F (36.7 C) 97.7 F (36.5 C) (!) 97.3 F (36.3 C) 97.8 F (36.6 C)  TempSrc: Oral Axillary Axillary Axillary  SpO2: 97% 97% 98% 100%  Weight:      Height:        Intake/Output Summary (Last 24 hours) at 07/10/2024 0757 Last data filed at 07/10/2024 9385 Gross per 24 hour  Intake 600 ml  Output 900 ml  Net -300 ml   Filed Weights   07/05/24 1836 07/06/24 1706  Weight: 79 kg 73.2 kg    Examination:  General: On room air.  No distress.  Chronically ill and deconditioned looking.  Extremely slow to respond, poor historian, confused.  Currently not agitated. respiratory: Decreased breath sounds at bases bilaterally with some crackles CVS: Currently rate controlled; S1-S2 heard  abdominal: Soft, nontender, slightly distended, no organomegaly; normal bowel sounds are heard  extremities: Trace lower extremity edema; no clubbing.     Data Reviewed: I have personally reviewed following labs and imaging studies  CBC: Recent Labs  Lab 07/05/24 1931 07/06/24 0456  WBC 6.1 5.8  NEUTROABS 3.9  --   HGB 14.8 13.5  HCT 44.6 40.8  MCV 99.8 101.7*  PLT 243 216   Basic Metabolic Panel: Recent Labs  Lab 07/05/24 1931 07/06/24 0456  NA 142 141  K 3.2* 3.5  CL 106 109  CO2 24 24  GLUCOSE 116* 94  BUN 26* 23  CREATININE 0.69 0.70  CALCIUM  8.8* 8.1*  MG 2.2  --  GFR: Estimated Creatinine Clearance: 78.6 mL/min (by C-G formula based on SCr of 0.7 mg/dL). Liver Function Tests: Recent Labs  Lab 07/05/24 1931  AST 25  ALT 31  ALKPHOS 81  BILITOT 1.0  PROT 7.0  ALBUMIN 3.7   No results for input(s): LIPASE, AMYLASE in the last 168 hours. No results for input(s): AMMONIA in the last 168 hours. Coagulation Profile: No results for input(s): INR, PROTIME in the last 168 hours. Cardiac Enzymes: No results for  input(s): CKTOTAL, CKMB, CKMBINDEX, TROPONINI in the last 168 hours. BNP (last 3 results) No results for input(s): PROBNP in the last 8760 hours. HbA1C: No results for input(s): HGBA1C in the last 72 hours. CBG: Recent Labs  Lab 07/06/24 1126  GLUCAP 93   Lipid Profile: No results for input(s): CHOL, HDL, LDLCALC, TRIG, CHOLHDL, LDLDIRECT in the last 72 hours. Thyroid  Function Tests: No results for input(s): TSH, T4TOTAL, FREET4, T3FREE, THYROIDAB in the last 72 hours. Anemia Panel: No results for input(s): VITAMINB12, FOLATE, FERRITIN, TIBC, IRON, RETICCTPCT in the last 72 hours. Sepsis Labs: Recent Labs  Lab 07/05/24 1931  LATICACIDVEN 1.3    Recent Results (from the past 240 hours)  Culture, blood (routine x 2)     Status: None (Preliminary result)   Collection Time: 07/05/24  7:31 PM   Specimen: BLOOD  Result Value Ref Range Status   Specimen Description BLOOD BLOOD LEFT ARM  Final   Special Requests   Final    BOTTLES DRAWN AEROBIC AND ANAEROBIC Blood Culture results may not be optimal due to an inadequate volume of blood received in culture bottles   Culture   Final    NO GROWTH 4 DAYS Performed at [facility], [address] [place], [place] [address]    Report Status PENDING  Incomplete  Culture, blood (routine x 2)     Status: None (Preliminary result)   Collection Time: 07/05/24  7:31 PM   Specimen: BLOOD  Result Value Ref Range Status   Specimen Description BLOOD BLOOD LEFT HAND  Final   Special Requests   Final    AEROBIC BOTTLE ONLY Blood Culture results may not be optimal due to an inadequate volume of blood received in culture bottles   Culture   Final    NO GROWTH 4 DAYS Performed at [facility], [address] [place], [place] [address]    Report Status PENDING  Incomplete         Radiology Studies: No results found.      Scheduled Meds:  citalopram   20 mg Oral Daily   cyanocobalamin    1,000 mcg Oral Daily   enoxaparin  (LOVENOX ) injection  40 mg Subcutaneous Q24H   memantine   10 mg Oral BID   QUEtiapine   50 mg Oral Daily   And   QUEtiapine   150 mg Oral QHS   Continuous Infusions:        Sophie Mao, MD [facility] 07/10/2024, 7:57 AM

## 2024-07-10 NOTE — Progress Notes (Signed)
 Repeat VS stable. Pt awakens to name called, continues with mumbling gibberish verbal response, does not follow commands. Pt with no urine output so far this shift (last urine emptied at 0630 this am). Pt has taken very little po fluids today. Pt currently will not suck on straw but does swallow when fluids spooned into his mouth (Aspiration precautions in place).  Pt's left eye sclera noted to be very bloodshot and thick yellow drainage with crusting of eyelid. Cleaned with warm soapy wash cloth to remove crusted film. MD Cheryle notified of no urine output and eye redness.

## 2024-07-10 NOTE — Consult Note (Signed)
 Attempted to see patient for psychiatric assessment via TTS cart.  Message sent to Suzen Hitchcock, RN caring for patient via secure chat.  No response.

## 2024-07-11 DIAGNOSIS — G9341 Metabolic encephalopathy: Secondary | ICD-10-CM | POA: Diagnosis not present

## 2024-07-11 LAB — CBC WITH DIFFERENTIAL/PLATELET
Abs Immature Granulocytes: 0.01 K/uL (ref 0.00–0.07)
Basophils Absolute: 0.1 K/uL (ref 0.0–0.1)
Basophils Relative: 2 %
Eosinophils Absolute: 0.1 K/uL (ref 0.0–0.5)
Eosinophils Relative: 3 %
HCT: 42.9 % (ref 39.0–52.0)
Hemoglobin: 14.4 g/dL (ref 13.0–17.0)
Immature Granulocytes: 0 %
Lymphocytes Relative: 41 %
Lymphs Abs: 2.1 K/uL (ref 0.7–4.0)
MCH: 33.3 pg (ref 26.0–34.0)
MCHC: 33.6 g/dL (ref 30.0–36.0)
MCV: 99.1 fL (ref 80.0–100.0)
Monocytes Absolute: 0.5 K/uL (ref 0.1–1.0)
Monocytes Relative: 10 %
Neutro Abs: 2.3 K/uL (ref 1.7–7.7)
Neutrophils Relative %: 44 %
Platelets: 236 K/uL (ref 150–400)
RBC: 4.33 MIL/uL (ref 4.22–5.81)
RDW: 13 % (ref 11.5–15.5)
WBC: 5.1 K/uL (ref 4.0–10.5)
nRBC: 0 % (ref 0.0–0.2)

## 2024-07-11 LAB — FOLATE: Folate: 16 ng/mL (ref >5.9)

## 2024-07-11 LAB — COMPREHENSIVE METABOLIC PANEL WITH GFR
ALT: 29 U/L (ref 0–44)
AST: 25 U/L (ref 15–41)
Albumin: 3.2 g/dL — ABNORMAL LOW (ref 3.5–5.0)
Alkaline Phosphatase: 73 U/L (ref 38–126)
Anion gap: 7 (ref 5–15)
BUN: 24 mg/dL — ABNORMAL HIGH (ref 8–23)
CO2: 22 mmol/L (ref 22–32)
Calcium: 8.4 mg/dL — ABNORMAL LOW (ref 8.9–10.3)
Chloride: 113 mmol/L — ABNORMAL HIGH (ref 98–111)
Creatinine, Ser: 0.69 mg/dL (ref 0.61–1.24)
GFR, Estimated: >60 mL/min (ref >60)
Glucose, Bld: 99 mg/dL (ref 70–99)
Potassium: 3.4 mmol/L — ABNORMAL LOW (ref 3.5–5.1)
Sodium: 142 mmol/L (ref 135–145)
Total Bilirubin: 1.4 mg/dL — ABNORMAL HIGH (ref 0.0–1.2)
Total Protein: 6.3 g/dL — ABNORMAL LOW (ref 6.5–8.1)

## 2024-07-11 LAB — TSH: TSH: 0.652 u[IU]/mL (ref 0.350–4.500)

## 2024-07-11 LAB — VITAMIN B12: Vitamin B-12: 2651 pg/mL — ABNORMAL HIGH (ref 180–914)

## 2024-07-11 LAB — AMMONIA: Ammonia: 24 umol/L (ref 9–35)

## 2024-07-11 LAB — MAGNESIUM: Magnesium: 2.1 mg/dL (ref 1.7–2.4)

## 2024-07-11 NOTE — Evaluation (Signed)
 Physical Therapy Evaluation Patient Details Name: Christopher Mccoy MRN: [id number] DOB: 1946/06/19 Today's Date: 07/11/2024  History of Present Illness  Christopher Mccoy is a 78 y.o. male with medical history significant for Alzheimer's disease, who presented to the emergency room with acute onset of altered mental status with confusion.  The patient has not been acting himself since last week per his wife.  He did not have any reported urinary frequency or urgency or dysuria or hematuria.  No cough or wheezing or dyspnea.  No nausea or vomiting or abdominal pain.  No fever or chills.  No bleeding diathesis.  No recent falls.   Clinical Impression  Patient presents confused with mittens on hands. Patient mostly non-verbal and info taken from his spouse.  Patient demonstrates slow labored movement for sitting up at bedside, once seated able to maintain sitting balance, required repeated verbal/tactile cueing before able to stand and limited to a few side steps at bedside before having to sit due to fatigue. Patient tolerated sitting up in chair after therapy with his spouse and nursing staff in room. Patient will benefit from continued skilled physical therapy in hospital and recommended venue below to increase strength, balance, endurance for safe ADLs and gait.         If plan is discharge home, recommend the following: Help with stairs or ramp for entrance;A lot of help with walking and/or transfers;A lot of help with bathing/dressing/bathroom;Assistance with cooking/housework;Assist for transportation   Can travel by private vehicle        Equipment Recommendations None recommended by PT  Recommendations for Other Services       Functional Status Assessment Patient has had a recent decline in their functional status and demonstrates the ability to make significant improvements in function in a reasonable and predictable amount of time.     Precautions / Restrictions Precautions Precautions:  Fall Recall of Precautions/Restrictions: Impaired Restrictions Weight Bearing Restrictions Per Provider Order: No      Mobility  Bed Mobility Overal bed mobility: Needs Assistance Bed Mobility: Supine to Sit     Supine to sit: Min assist, Mod assist     General bed mobility comments: slow labored movement    Transfers Overall transfer level: Needs assistance Equipment used: 1 person hand held assist Transfers: Sit to/from Stand, Bed to chair/wheelchair/BSC Sit to Stand: Min assist, Mod assist   Step pivot transfers: Mod assist       General transfer comment: slow labored movement, requires repeated verbal/tactile cueing    Ambulation/Gait Ambulation/Gait assistance: Mod assist Gait Distance (Feet): 6 Feet Assistive device: Rolling walker (2 wheels) Gait Pattern/deviations: Decreased step length - left, Decreased stance time - right, Decreased stride length Gait velocity: decreased     General Gait Details: limited to a few side steps at bedside with hand held asist  Stairs            Wheelchair Mobility     Tilt Bed    Modified Rankin (Stroke Patients Only)       Balance Overall balance assessment: Needs assistance Sitting-balance support: Feet supported, No upper extremity supported Sitting balance-Leahy Scale: Fair Sitting balance - Comments: seated at EOB   Standing balance support: During functional activity, No upper extremity supported Standing balance-Leahy Scale: Poor Standing balance comment: fair/poor with hand held assist                             Pertinent Vitals/Pain Pain Assessment  Pain Assessment: No/denies pain    Home Living Family/patient expects to be discharged to:: Private residence Living Arrangements: Spouse/significant other Available Help at Discharge: Family;Available 24 hours/day Type of Home: House Home Access: Stairs to enter   Entrance Stairs-Number of Steps: 2 Alternate Level Stairs-Number  of Steps: 13 steps to 2nd floor (spouse states patient does not have to go upstairs) Home Layout: Two level;Full bath on main level;Able to live on main level with bedroom/bathroom Home Equipment: None      Prior Function Prior Level of Function : Needs assist       Physical Assist : Mobility (physical);ADLs (physical) Mobility (physical): Bed mobility;Transfers;Gait;Stairs   Mobility Comments: hand held assisted household and short distanced community ambulation ADLs Comments: assisted by family     Extremity/Trunk Assessment   Upper Extremity Assessment Upper Extremity Assessment: Generalized weakness    Lower Extremity Assessment Lower Extremity Assessment: Generalized weakness    Cervical / Trunk Assessment Cervical / Trunk Assessment: Kyphotic  Communication   Communication Communication: Impaired Factors Affecting Communication: Difficulty expressing self    Cognition Arousal: Alert Behavior During Therapy: Anxious, Flat affect   PT - Cognitive impairments: History of cognitive impairments                       PT - Cognition Comments: requires repeated verbal/tactile cueing for following instructions Following commands: Impaired Following commands impaired: Follows one step commands with increased time     Cueing Cueing Techniques: Verbal cues, Gestural cues, Tactile cues     General Comments      Exercises     Assessment/Plan    PT Assessment Patient needs continued PT services  PT Problem List Decreased strength;Decreased activity tolerance;Decreased balance;Decreased mobility;Decreased safety awareness;Decreased knowledge of use of DME       PT Treatment Interventions DME instruction;Gait training;Stair training;Functional mobility training;Therapeutic activities;Therapeutic exercise;Balance training;Patient/family education    PT Goals (Current goals can be found in the Care Plan section)  Acute Rehab PT Goals Patient Stated Goal:  not stated by patient, spouse wants to take him home PT Goal Formulation: With patient/family Time For Goal Achievement: 07/15/24 Potential to Achieve Goals: Good    Frequency Min 3X/week     Co-evaluation               AM-PAC PT 6 Clicks Mobility  Outcome Measure Help needed turning from your back to your side while in a flat bed without using bedrails?: A Little Help needed moving from lying on your back to sitting on the side of a flat bed without using bedrails?: A Little Help needed moving to and from a bed to a chair (including a wheelchair)?: A Lot Help needed standing up from a chair using your arms (e.g., wheelchair or bedside chair)?: A Lot Help needed to walk in hospital room?: A Lot Help needed climbing 3-5 steps with a railing? : A Lot 6 Click Score: 14    End of Session   Activity Tolerance: Patient tolerated treatment well;Patient limited by fatigue Patient left: in chair;with call bell/phone within reach;with family/visitor present;with nursing/sitter in room Nurse Communication: Mobility status PT Visit Diagnosis: Unsteadiness on feet (R26.81);Difficulty in walking, not elsewhere classified (R26.2);Other symptoms and signs involving the nervous system (R29.898)    Time: 1115-1140 PT Time Calculation (min) (ACUTE ONLY): 25 min   Charges:   PT Evaluation $PT Eval Moderate Complexity: 1 Mod PT Treatments $Therapeutic Activity: 23-37 mins PT General Charges $$ ACUTE PT VISIT:  1 Visit         3:05 PM, 07/11/24 Lynwood Music, MPT Physical Therapist with [facility] [phone] office [phone] mobile phone

## 2024-07-11 NOTE — Progress Notes (Signed)
 PROGRESS NOTE    Christopher Mccoy  FMW:[id number] DOB: 10-04-1946 DOA: 07/05/2024 PCP: [facility]   Brief Narrative:  78 year old male with history of Alzheimer's dementia was admitted with concern for increasing confusion.  Assessment & Plan:   Acute metabolic encephalopathy in the setting of underlying dementia complicated by delirium Dementia with behavioral disturbance Anxiety and depression Goals of care - Unclear etiology.  Possibly due to worsening dementia.  Dehydration might be a contributing factor but this has been already corrected with IV fluids. -No infectious etiology with negative UA.  CT of the head negative for acute abnormality.  Blood culture negative so far - ammonia,TSH normal.  B12 and folate levels pending.  Other metabolic panel unremarkable - Currently on Seroquel .  Increased dose of morning Seroquel  to 100 mg on 06/09/2024.  Continue nightly 150 mg Seroquel .  Continues to require as needed Zyprexa .  Psychiatry evaluation pending. -do not suspect meningoencephalitis or a stroke. At this time LP or MRI of brain will be extremely challenging and doubt that these are needed. Wife agreeable with the same.  - Try and avoid IV Ativan  as much as possible - Palliative care for goals of care discussion is pending.  Oral intake worsening.  Patient might need placement in a memory care unit or Geri psych facility: Wife currently not agreeable for the same. - Fall precautions.  PT eval. - Continue citalopram  and memantine   Dehydration - Treated with IV fluids and subsequently discontinued  DVT prophylaxis: Lovenox  Code Status: DNR Family Communication: Wife at bedside Disposition Plan: Status is: Inpatient Remains inpatient appropriate because: Of severe illness  Consultants: palliative care and psychiatry consultation pending  Procedures: None  Antimicrobials: None   Subjective: Patient seen and examined at bedside.  Poor historian.  No seizures,  fever, vomiting reported.  Oral intake is poor as per nursing staff. Objective: Vitals:   07/10/24 1400 07/10/24 1744 07/10/24 2101 07/11/24 0402  BP: (!) 108/53 110/67 117/77 (!) 101/59  Pulse: 72 94 79 78  Resp: 18  18 16   Temp:  98.4 F (36.9 C) (!) 97.4 F (36.3 C) 98.4 F (36.9 C)  TempSrc:  Axillary Axillary Axillary  SpO2: 98% 97% 94% 94%  Weight:      Height:        Intake/Output Summary (Last 24 hours) at 07/11/2024 0758 Last data filed at 07/11/2024 0657 Gross per 24 hour  Intake --  Output 300 ml  Net -300 ml   Filed Weights   07/05/24 1836 07/06/24 1706  Weight: 79 kg 73.2 kg    Examination:  General: No acute distress.  Currently on room air.  Chronically ill and deconditioned looking.  Is very slow to respond, poor historian, confused.  Not agitated currently. respiratory: Bilateral decreased breath sounds at bases with scattered crackles CVS: S1-S2 heard; rate currently controlled abdominal: Soft, nontender, distended mildly; no organomegaly; bowel sounds are normally heard  extremities: No cyanosis; mild lower extremity edema present Data Reviewed: I have personally reviewed following labs and imaging studies  CBC: Recent Labs  Lab 07/05/24 1931 07/06/24 0456 07/11/24 0623  WBC 6.1 5.8 5.1  NEUTROABS 3.9  --  2.3  HGB 14.8 13.5 14.4  HCT 44.6 40.8 42.9  MCV 99.8 101.7* 99.1  PLT 243 216 236   Basic Metabolic Panel: Recent Labs  Lab 07/05/24 1931 07/06/24 0456 07/11/24 0623  NA 142 141 142  K 3.2* 3.5 3.4*  CL 106 109 113*  CO2 24 24 22  GLUCOSE 116* 94 99  BUN 26* 23 24*  CREATININE 0.69 0.70 0.69  CALCIUM  8.8* 8.1* 8.4*  MG 2.2  --  2.1   GFR: Estimated Creatinine Clearance: 78.6 mL/min (by C-G formula based on SCr of 0.69 mg/dL). Liver Function Tests: Recent Labs  Lab 07/05/24 1931 07/11/24 0623  AST 25 25  ALT 31 29  ALKPHOS 81 73  BILITOT 1.0 1.4*  PROT 7.0 6.3*  ALBUMIN 3.7 3.2*   No results for input(s): LIPASE,  AMYLASE in the last 168 hours. Recent Labs  Lab 07/11/24 0623  AMMONIA 24   Coagulation Profile: No results for input(s): INR, PROTIME in the last 168 hours. Cardiac Enzymes: No results for input(s): CKTOTAL, CKMB, CKMBINDEX, TROPONINI in the last 168 hours. BNP (last 3 results) No results for input(s): PROBNP in the last 8760 hours. HbA1C: No results for input(s): HGBA1C in the last 72 hours. CBG: Recent Labs  Lab 07/06/24 1126  GLUCAP 93   Lipid Profile: No results for input(s): CHOL, HDL, LDLCALC, TRIG, CHOLHDL, LDLDIRECT in the last 72 hours. Thyroid  Function Tests: Recent Labs    07/11/24 0623  TSH 0.652   Anemia Panel: No results for input(s): VITAMINB12, FOLATE, FERRITIN, TIBC, IRON, RETICCTPCT in the last 72 hours. Sepsis Labs: Recent Labs  Lab 07/05/24 1931  LATICACIDVEN 1.3    Recent Results (from the past 240 hours)  Culture, blood (routine x 2)     Status: None   Collection Time: 07/05/24  7:31 PM   Specimen: BLOOD  Result Value Ref Range Status   Specimen Description BLOOD BLOOD LEFT ARM  Final   Special Requests   Final    BOTTLES DRAWN AEROBIC AND ANAEROBIC Blood Culture results may not be optimal due to an inadequate volume of blood received in culture bottles   Culture   Final    NO GROWTH 5 DAYS Performed at [facility], [address], [place], [place] [address]    Report Status 07/10/2024 FINAL  Final  Culture, blood (routine x 2)     Status: None   Collection Time: 07/05/24  7:31 PM   Specimen: BLOOD  Result Value Ref Range Status   Specimen Description BLOOD BLOOD LEFT HAND  Final   Special Requests   Final    AEROBIC BOTTLE ONLY Blood Culture results may not be optimal due to an inadequate volume of blood received in culture bottles   Culture   Final    NO GROWTH 5 DAYS Performed at [facility], [address] [place], [place] [address]    Report Status 07/10/2024 FINAL  Final          Radiology Studies: No results found.      Scheduled Meds:  citalopram   20 mg Oral Daily   cyanocobalamin   1,000 mcg Oral Daily   enoxaparin  (LOVENOX ) injection  40 mg Subcutaneous Q24H   memantine   10 mg Oral BID   QUEtiapine   100 mg Oral Daily   And   QUEtiapine   150 mg Oral QHS   QUEtiapine   50 mg Oral Once   Continuous Infusions:        Sophie Mao, MD [facility] 07/11/2024, 7:58 AM

## 2024-07-11 NOTE — Plan of Care (Signed)
  Problem: Acute Rehab PT Goals(only PT should resolve) Goal: Pt Will Go Supine/Side To Sit Outcome: Progressing Flowsheets (Taken 07/11/2024 1506) Pt will go Supine/Side to Sit:  with contact guard assist  with minimal assist Goal: Patient Will Transfer Sit To/From Stand Outcome: Progressing Flowsheets (Taken 07/11/2024 1506) Patient will transfer sit to/from stand:  with contact guard assist  with minimal assist Goal: Pt Will Transfer Bed To Chair/Chair To Bed Outcome: Progressing Flowsheets (Taken 07/11/2024 1506) Pt will Transfer Bed to Chair/Chair to Bed: with min assist Goal: Pt Will Ambulate Outcome: Progressing Flowsheets (Taken 07/11/2024 1506) Pt will Ambulate:  50 feet  with minimal assist Note: Hand held assist   3:07 PM, 07/11/24 Lynwood Music, MPT Physical Therapist with [facility] [phone] office [phone] mobile phone

## 2024-07-12 DIAGNOSIS — G309 Alzheimer's disease, unspecified: Secondary | ICD-10-CM

## 2024-07-12 DIAGNOSIS — Z515 Encounter for palliative care: Secondary | ICD-10-CM

## 2024-07-12 DIAGNOSIS — Z7189 Other specified counseling: Secondary | ICD-10-CM

## 2024-07-12 DIAGNOSIS — F02C18 Dementia in other diseases classified elsewhere, severe, with other behavioral disturbance: Secondary | ICD-10-CM

## 2024-07-12 DIAGNOSIS — Z558 Other problems related to education and literacy: Secondary | ICD-10-CM

## 2024-07-12 DIAGNOSIS — G9341 Metabolic encephalopathy: Secondary | ICD-10-CM | POA: Diagnosis not present

## 2024-07-12 MED ORDER — QUETIAPINE FUMARATE 50 MG PO TABS: 50.0000 mg | ORAL_TABLET | ORAL | 0 refills | Status: AC

## 2024-07-12 MED ORDER — SENNOSIDES-DOCUSATE SODIUM 8.6-50 MG PO TABS: 2.0000 | ORAL_TABLET | Freq: Every evening | ORAL | 0 refills | Status: AC | PRN

## 2024-07-12 NOTE — TOC Transition Note (Signed)
 Transition of Care [facility]) - Discharge Note   Patient Details  Name: Christopher Mccoy MRN: [id number] Date of Birth: 1946-07-18  [facility] CM/SW Contact:  Hoy DELENA Bigness, LCSW Phone Number: 07/12/2024, 11:10 AM   Clinical Narrative:    Pt recommended for hospice services. Pt's spouse is agreeable to plan for pt returning home with hospice. CSW reviewed hospice agencies with pt's wife who would like services with [facility]. Pt's wife reports having all needed DME in thehome however, has been working on getting a wheelchair for pt. Referral made to [facility] with [facility] and informed of need for wheelchair. [facility] aware of plan for discharge today. Pt will be transported home via [facility]. EMS will be called once DC orders in place.    Final next level of care: Home w Hospice Care Barriers to Discharge: Barriers Resolved   Patient Goals and CMS Choice Patient states their goals for this hospitalization and ongoing recovery are:: Return home with hospice CMS Medicare.gov Compare Post Acute Care list provided to:: Patient Represenative (must comment) (Spouse) Choice offered to / list presented to : Spouse [place] ownership interest in [facility].provided to::  (n/a)    Discharge Placement                Patient to be transferred to facility by: [facility] Name of family member notified: Spouse Patient and family notified of of transfer: 07/12/24  Discharge Plan and Services Additional resources added to the After Visit Summary for   In-house Referral: Clinical Social Work              DME Arranged: N/A DME Agency: NA                  Social Drivers of Health (SDOH) Interventions SDOH Screenings   Food Insecurity: No Food Insecurity (07/06/2024)  Housing: Low Risk  (07/06/2024)  Transportation Needs: No Transportation Needs (07/06/2024)  Utilities: Not At Risk (07/06/2024)  Social Connections: Patient Unable To Answer (07/06/2024)  Tobacco Use: High Risk  (07/05/2024)     Readmission Risk Interventions    07/12/2024   11:03 AM 07/08/2024   10:08 AM 07/07/2024   10:40 AM  Readmission Risk Prevention Plan  Transportation Screening Complete Complete Complete  PCP or Specialist Appt within 3-5 Days Complete    HRI or Home Care Consult Complete Complete Complete  Social Work Consult for Recovery Care Planning/Counseling Complete Complete Complete  Palliative Care Screening Complete Not Applicable Not Applicable  Medication Review Oceanographer) Complete Complete Complete

## 2024-07-12 NOTE — Consult Note (Signed)
 Consultation Note Date: 07/12/2024   Patient Name: Christopher Mccoy  DOB: 02-05-1946  MRN: [id number]  Age / Sex: 79 y.o., male  [facility]P: [facility] Referring Physician: Cheryle Page, MD  Reason for Consultation: Establishing goals of care  HPI/Patient Profile: 78 y.o. male  with past medical history of Alzheimer's dementia admitted on 07/05/2024 with increased confusion.   Workup reveals findings consistent with acute metabolic encephalopathy in the setting of underlying dementia complicated by delirium.  Patient noted to have some behavioral disturbance associated with dementia and intermittent agitation.  Etiology of his metabolic encephalopathy is unclear.  He did receive some IV fluids for possible dehydration without significant improvement in his mental status.  Infectious and encephalopathy workup was negative.  Patient requires antipsychotic therapy to maintain safety and address agitation.  Noted to have declining function and poor oral intake.  Prior acute care setting encounters reviewed.  Patient has had 1 ED visit and 2 hospitalizations over the past 6 months.  Both hospitalizations were for altered mental status in the setting of a UTI.  ED visit was for increased falls and orthostatic hypotension.  PMT has been consulted to assist with goals of care conversation.  Lab work independently reviewed, mild hypokalemia noted at 3.4.  Chloride minimally elevated at 113.  Renal function stable with creatinine 1.69, BUN 24, and eGFR greater than 60.  Mild hypocalcemia noted in the setting of hypoalbuminemia.  Calcium  corrects to 9. Low albumin levels are associated with greater disease burden and poorer long-term prognosis.  Total bilirubin elevated minimally at 1.4.  Could be related to hemoconcentration.  CBC unremarkable with normal white count and normal H&H.  Lab findings reviewed with patient's caregiver.  EKG independently reviewed.  Noted to be in  sinus rhythm with occasional PVCs.  QTc significantly prolonged at 611.Would use caution with QTc prolonging medications.  Today, patient is quite confused and unable to provide HPI/ROS.  Therefore, independent history obtained from wife and nursing staff.  Nursing staff report that patient has had very little p.o. intake today.  Basically no food intake but would take his medicines.  Very limited fluid intake as well.  They report no significant behaviors on their shift.  Independent history obtained from wife.  Clinical Assessment and Goals of Care:  I have reviewed medical records including EPIC notes, labs and imaging (independently reviewed), prior hospital encounters, assessed the patient and then met with patient's wife to discuss diagnosis prognosis, GOC, EOL wishes, disposition and options. Collaborated directly with attending physician, TOC, and bedside nursing staff.   I introduced Palliative Medicine as specialized medical care for people living with serious illness. It focuses on providing relief from the symptoms and stress of a serious illness. The goal is to improve quality of life for both the patient and the family.  We discussed a brief life review of the patient and then focused on their current illness.   I attempted to elicit values and goals of care important to the patient.    Medical History Review and Family/Patient Understanding:   Patient's wife seems to have a fair understanding of patient's current illness.  With permission, engaged in a detailed conversation with the wife about the seriousness of patient's current illness, in light of his advanced age and co morbidities, and explained the potential implications this may have for his recovery and long-term well-being.  Provided extensive education regarding dementia disease trajectory and reviewed current findings and patient that makes us  concerned he is  in the terminal phases of dementia.  Social  History:  Patient lives at home with his wife.  They do have a significant amount of help.  [facility] 40 hours of paid caregivers they also have a daughter that lives with them and multiple children and grandchildren who provide assistance.  Functional and Nutritional State:  Prior to this hospitalization, patient was doing okay.  They were having some difficulty due to declining functional ability and swallowing difficulties but were doing okay.  They do note that he was fully incontinent prior to the hospitalization.  Chronically confused.  Appetite was stable until he became ill this time.  Now, patient is largely bedbound, appetite is nominal, and he has progressive confusion.  Palliative Symptoms:  Intermittent agitation, calm at present  Advance care planning Discussion:  The wife consented to a voluntary Advance Care Planning Conversation in person. Individuals present for the conversation: this NP and wife Audria Meres)  A detailed discussion regarding GOC, advanced directives, and anticipatory care planning was had. I discussed with wife our concerns regarding the progression of patient's chronic condition in the context of his existing comorbidities, and explained how this could potentially impact her future health.  Reiterated concern that he is in the terminal phases of his illness.  Given, limited life expectancy.  Engaged in a discussion about goals of care.  Patient's wife expresses she is really hoping to take him home.  They want to focus on comfort and quality of life.  She does acknowledge how difficult each hospitalization has been for him and that he seems worse off with each 1 and therefore desires to keep him at home and out of the hospital.  She is his healthcare decision-maker. Engaged in discussion of advance directives including the limitations and potential burdens of CPR and intubation, particularly in the context of advanced age and serious/terminal underlying  health conditions.  Patient's wife confirms that he is already a DNR/DNI.  She shares they do have a goldenrod form present here at the hospital and at home.  Given goals of care, hospice would be appropriate. Reviewed hospice philosophy of care, including focus on comfort, quality of life, and support for patients and families facing terminal illness. Discussed eligibility criteria and potential benefits of enrollment. Discussed multiple hospice care settings, including home-based hospice, long-term care facilities with hospice services, and inpatient hospice units. Reviewed the nature of care delivery in each setting, admission criteria (he would likely meet inpatient criteria at this point, inpatient hospice referral offered), and associated financial considerations.  Patient's wife desire to bring patient home with hospice support.  No DME needs at this time.  Discussed the importance of continued conversation with family and the medical providers regarding overall plan of care and treatment options, ensuring decisions are within the context of the patient's values and GOCs.   Questions and concerns were addressed.  The family was encouraged to call with questions or concerns.  PMT will continue to support holistically.   Primary Decision maker and health care surrogate:  HCPOA/wife  Code Status:  DNR/DNI    Outcome of the conversations: patient with transition home with hospice support  I spent 35 minutes providing separately identifiable ACP services with the patient and/or surrogate decision maker in a voluntary, in-person conversation discussing the patient's wishes and goals as detailed in the above note.  SUMMARY OF RECOMMENDATIONS    DNR/DNI Transition home with home hospice support Symptoms appear well-controlled at present therefore no changes to  current regimen Use caution when prescribing medications that may prolong Qtc Palliative medicine team will continue to follow for  ongoing goals of care discussion, symptom management, and coordination of care.  Code Status/Advance Care Planning: DNR   Symptom Management:  Symptoms stable at present, therefore continue symptom regimen per admitting team with PMT available as needed for support   Prognosis:  < 6 weeks  Discharge Planning: Home with Hospice      Primary Diagnoses: Present on Admission:  Acute metabolic encephalopathy    Physical Exam Constitutional:      General: He is not in acute distress.    Appearance: He is ill-appearing.     Comments: Appears comfortable, smiling, no behaviors  Pulmonary:     Effort: Pulmonary effort is normal. No respiratory distress.  Skin:    General: Skin is warm and dry.  Neurological:     Mental Status: He is confused.     Vital Signs: BP (!) 134/120 (BP Location: Left Arm)   Pulse 86   Temp 98.6 F (37 C) (Axillary)   Resp 16   Ht 5' 9 (1.753 m)   Wt 73.2 kg   SpO2 95%   BMI 23.83 kg/m  Pain Scale: 0-10   Pain Score: 0-No pain   SpO2: SpO2: 95 % O2 Device:SpO2: 95 % O2 Flow Rate: .    Palliative Assessment/Data: 20 to 30%   Billing based on MDM: High  Problems Addressed: One acute or chronic illness or injury that poses a threat to life or bodily function  Amount and/or Complexity of Data: Category 1:Review of prior external note(s) from each unique source and Assessment requiring an independent historian(s), Category 2:Independent interpretation of a test performed by another physician/other qualified health care professional (not separately reported), and Category 3:Discussion of management or test interpretation with external physician/other qualified health care professional/appropriate source (not separately reported)  Risks: N/AA   Laymon CHRISTELLA Pinal, NP  Palliative Medicine Team Team phone # [phone]  Thank you for allowing the Palliative Medicine Team to assist in the care of this patient. Please utilize secure chat  with additional questions, if there is no response within 30 minutes please call the above phone number.  Palliative Medicine Team providers are available by phone from 7am to 7pm daily and can be reached through the team cell phone.  Should this patient require assistance outside of these hours, please call the patient's attending physician.

## 2024-07-12 NOTE — Plan of Care (Signed)
  Problem: Education: Goal: Knowledge of General Education information will improve Description: Including pain rating scale, medication(s)/side effects and non-pharmacologic comfort measures 07/12/2024 0702 by German Hollering, LPN Outcome: Not Met (add Reason) 07/12/2024 0701 by German Hollering, LPN Outcome: Not Met (add Reason)   Problem: Health Behavior/Discharge Planning: Goal: Ability to manage health-related needs will improve 07/12/2024 0702 by German Hollering, LPN Outcome: Not Met (add Reason) 07/12/2024 0701 by German Hollering, LPN Outcome: Not Met (add Reason)   Problem: Clinical Measurements: Goal: Ability to maintain clinical measurements within normal limits will improve 07/12/2024 0702 by German Hollering, LPN Outcome: Not Met (add Reason) 07/12/2024 0701 by German Hollering, LPN Outcome: Not Met (add Reason) Goal: Will remain free from infection 07/12/2024 0702 by German Hollering, LPN Outcome: Not Met (add Reason) 07/12/2024 0701 by German Hollering, LPN Outcome: Not Met (add Reason) Goal: Diagnostic test results will improve 07/12/2024 0702 by German Hollering, LPN Outcome: Not Met (add Reason) 07/12/2024 0701 by German Hollering, LPN Outcome: Not Met (add Reason) Goal: Respiratory complications will improve 07/12/2024 0702 by German Hollering, LPN Outcome: Not Met (add Reason) 07/12/2024 0701 by German Hollering, LPN Outcome: Not Met (add Reason) Goal: Cardiovascular complication will be avoided 07/12/2024 0702 by German Hollering, LPN Outcome: Not Met (add Reason) 07/12/2024 0701 by German Hollering, LPN Outcome: Not Met (add Reason)   Problem: Activity: Goal: Risk for activity intolerance will decrease 07/12/2024 0702 by German Hollering, LPN Outcome: Not Met (add Reason) 07/12/2024 0701 by German Hollering, LPN Outcome: Not Met (add Reason)   Problem: Nutrition: Goal: Adequate nutrition will be maintained 07/12/2024 0702 by German Hollering, LPN Outcome: Not Met (add  Reason) 07/12/2024 0701 by German Hollering, LPN Outcome: Not Met (add Reason)   Problem: Coping: Goal: Level of anxiety will decrease 07/12/2024 0702 by German Hollering, LPN Outcome: Not Met (add Reason) 07/12/2024 0701 by German Hollering, LPN Outcome: Not Met (add Reason)   Problem: Elimination: Goal: Will not experience complications related to bowel motility 07/12/2024 0702 by German Hollering, LPN Outcome: Not Met (add Reason) 07/12/2024 0701 by German Hollering, LPN Outcome: Not Met (add Reason) Goal: Will not experience complications related to urinary retention 07/12/2024 0702 by German Hollering, LPN Outcome: Not Met (add Reason) 07/12/2024 0701 by German Hollering, LPN Outcome: Not Met (add Reason)   Problem: Pain Managment: Goal: General experience of comfort will improve and/or be controlled 07/12/2024 0702 by German Hollering, LPN Outcome: Not Met (add Reason) 07/12/2024 0701 by German Hollering, LPN Outcome: Not Met (add Reason)   Problem: Safety: Goal: Ability to remain free from injury will improve 07/12/2024 0702 by German Hollering, LPN Outcome: Not Met (add Reason) 07/12/2024 0701 by German Hollering, LPN Outcome: Not Met (add Reason)   Problem: Skin Integrity: Goal: Risk for impaired skin integrity will decrease 07/12/2024 0702 by German Hollering, LPN Outcome: Not Met (add Reason) 07/12/2024 0701 by German Hollering, LPN Outcome: Not Met (add Reason)

## 2024-07-12 NOTE — Progress Notes (Signed)
 Patient has discharge orders, discharge teaching given to family members bedside with no further questions at this time.

## 2024-07-12 NOTE — Discharge Summary (Signed)
 Physician Discharge Summary  Christopher Mccoy FMW:[id number] DOB: 1946-04-30 DOA: 07/05/2024  PCP: [facility]  Admit date: 07/05/2024 Discharge date: 07/12/2024  Admitted From: Home Disposition: Home with hospice  Recommendations for Outpatient Follow-up:  Follow up with home hospice at earliest convenience    Home Health: No Equipment/Devices: None  Discharge Condition: Poor CODE STATUS: DNR Diet recommendation: As tolerated  Brief/Interim Summary: 78 year old male with history of Alzheimer's dementia was admitted with concern for increasing confusion.  During the hospitalization, workup IANC remain negative.  He required intermittent Zyprexa  and Seroquel  a.m. dose has been increased.  Wife now agreeable for discharge home with hospice.  Patient will be discharged home with hospice once arrangements have been made.  Discharge Diagnoses:   Acute metabolic encephalopathy in the setting of underlying dementia complicated by delirium Dementia with behavioral disturbance Anxiety and depression Goals of care - Unclear etiology.  Possibly due to worsening dementia.  Dehydration might be a contributing factor but this has been already corrected with IV fluids. -No infectious etiology with negative UA.  CT of the head negative for acute abnormality.  Blood culture negative so far - ammonia,TSH normal.  B12 and folate levels pending.  Other metabolic panel unremarkable - Currently on Seroquel .  Increased dose of morning Seroquel  to 100 mg on 06/09/2024.  Continue nightly 150 mg Seroquel .   -do not suspect meningoencephalitis or a stroke. At this time LP or MRI of brain will be extremely challenging and doubt that these are needed. Wife agreeable with the same.  - Try and avoid IV Ativan  as much as possible - Palliative care for goals of care discussion is pending.   -Oral intake worsening.  Wife now agreeable for discharge home with hospice.  Patient will be discharged home with  hospice once arrangements have been made. - Continue citalopram  and memantine    Dehydration - Treated with IV fluids and subsequently discontinued     Discharge Instructions  Discharge Instructions     Diet general   Complete by: As directed    Increase activity slowly   Complete by: As directed       Allergies as of 07/12/2024       Reactions   Penicillins Anaphylaxis   Haldol  [haloperidol ] Itching, Other (See Comments)   Aggressive, confused, irritated, very altered        Medication List     STOP taking these medications    LORazepam  0.5 MG tablet Commonly known as: ATIVAN        TAKE these medications    acetaminophen  325 MG tablet Commonly known as: TYLENOL  Take 2 tablets (650 mg total) by mouth every 6 (six) hours as needed for mild pain (pain score 1-3) or fever (or Fever >/= 101).   citalopram  20 MG tablet Commonly known as: CeleXA  Take 1 tablet (20 mg total) by mouth daily.   cyanocobalamin  1000 MCG tablet Take 1,000 mcg by mouth daily.   memantine  10 MG tablet Commonly known as: NAMENDA  Take 1 tablet (10 mg total) by mouth 2 (two) times daily.   QUEtiapine  50 MG tablet Commonly known as: SEROQUEL  Take 1 tablet (50 mg total) by mouth See admin instructions. Take 150 mg at bedtime and 100 mg in the morning What changed: additional instructions   senna-docusate 8.6-50 MG tablet Commonly known as: Senokot-S Take 2 tablets by mouth at bedtime as needed for mild constipation.        Follow-up Information     home hospice Follow up.  Why: At earliest convenience               Allergies  Allergen Reactions   Penicillins Anaphylaxis   Haldol  [Haloperidol ] Itching and Other (See Comments)    Aggressive, confused, irritated, very altered    Consultations: Palliative care evaluation pending   Procedures/Studies: DG Chest Port 1 View Result Date: 07/05/2024 CLINICAL DATA:  Weakness and altered mental status. EXAM: PORTABLE  CHEST 1 VIEW COMPARISON:  04/13/2024 FINDINGS: The cardiomediastinal contours are stable. Chronic elevation of right hemidiaphragm. Pulmonary vasculature is normal. No consolidation, pleural effusion, or pneumothorax. No acute osseous abnormalities are seen. IMPRESSION: No active disease. Electronically Signed   By: Andrea Gasman M.D.   On: 07/05/2024 21:27   CT Head Wo Contrast Result Date: 07/05/2024 CLINICAL DATA:  Altered mental status EXAM: CT HEAD WITHOUT CONTRAST TECHNIQUE: Contiguous axial images were obtained from the base of the skull through the vertex without intravenous contrast. RADIATION DOSE REDUCTION: This exam was performed according to the departmental dose-optimization program which includes automated exposure control, adjustment of the mA and/or kV according to patient size and/or use of iterative reconstruction technique. COMPARISON:  05/06/2024 FINDINGS: Brain: No evidence of acute infarction, hemorrhage, hydrocephalus, extra-axial collection or mass lesion/mass effect. Chronic atrophic and white matter ischemic changes noted. Vascular: No hyperdense vessel or unexpected calcification. Skull: Normal. Negative for fracture or focal lesion. Sinuses/Orbits: No acute finding. Other: None. IMPRESSION: Chronic changes without acute abnormality. No change from the prior exam. Electronically Signed   By: Oneil Devonshire M.D.   On: 07/05/2024 20:11      Subjective: Patient seen and examined at bedside.  Wakes up slightly, poor historian.  Oral intake has been getting worse as per wife at bedside.  No fever or agitation reported  Discharge Exam: Vitals:   07/11/24 1936 07/12/24 1129  BP: (!) 134/120 (!) 91/59  Pulse: 86 76  Resp: 16 18  Temp: 98.6 F (37 C) 98.2 F (36.8 C)  SpO2: 95% 98%    General: Elderly male lying in bed.  Wakes up slightly.  Extremely slow to respond.  Poor historian.  On room air.  Cardiovascular: rate controlled, S1/S2 + Respiratory: bilateral decreased  breath sounds at bases with scattered crackles Abdominal: Soft, NT, ND, bowel sounds + Extremities: no edema, no cyanosis    The results of significant diagnostics from this hospitalization (including imaging, microbiology, ancillary and laboratory) are listed below for reference.     Microbiology: Recent Results (from the past 240 hours)  Culture, blood (routine x 2)     Status: None   Collection Time: 07/05/24  7:31 PM   Specimen: BLOOD  Result Value Ref Range Status   Specimen Description BLOOD BLOOD LEFT ARM  Final   Special Requests   Final    BOTTLES DRAWN AEROBIC AND ANAEROBIC Blood Culture results may not be optimal due to an inadequate volume of blood received in culture bottles   Culture   Final    NO GROWTH 5 DAYS Performed at [facility], [address] [place], [place] [address]    Report Status 07/10/2024 FINAL  Final  Culture, blood (routine x 2)     Status: None   Collection Time: 07/05/24  7:31 PM   Specimen: BLOOD  Result Value Ref Range Status   Specimen Description BLOOD BLOOD LEFT HAND  Final   Special Requests   Final    AEROBIC BOTTLE ONLY Blood Culture results may not be optimal due to an  inadequate volume of blood received in culture bottles   Culture   Final    NO GROWTH 5 DAYS Performed at [facility], [address], [place], [place] [address]    Report Status 07/10/2024 FINAL  Final     Labs: BNP (last 3 results) No results for input(s): BNP in the last 8760 hours. Basic Metabolic Panel: Recent Labs  Lab 07/05/24 1931 07/06/24 0456 07/11/24 0623  NA 142 141 142  K 3.2* 3.5 3.4*  CL 106 109 113*  CO2 24 24 22   GLUCOSE 116* 94 99  BUN 26* 23 24*  CREATININE 0.69 0.70 0.69  CALCIUM  8.8* 8.1* 8.4*  MG 2.2  --  2.1   Liver Function Tests: Recent Labs  Lab 07/05/24 1931 07/11/24 0623  AST 25 25  ALT 31 29  ALKPHOS 81 73  BILITOT 1.0 1.4*  PROT 7.0 6.3*  ALBUMIN 3.7 3.2*   No results for input(s): LIPASE, AMYLASE in the  last 168 hours. Recent Labs  Lab 07/11/24 0623  AMMONIA 24   CBC: Recent Labs  Lab 07/05/24 1931 07/06/24 0456 07/11/24 0623  WBC 6.1 5.8 5.1  NEUTROABS 3.9  --  2.3  HGB 14.8 13.5 14.4  HCT 44.6 40.8 42.9  MCV 99.8 101.7* 99.1  PLT 243 216 236   Cardiac Enzymes: No results for input(s): CKTOTAL, CKMB, CKMBINDEX, TROPONINI in the last 168 hours. BNP: Invalid input(s): POCBNP CBG: Recent Labs  Lab 07/06/24 1126  GLUCAP 93   D-Dimer No results for input(s): DDIMER in the last 72 hours. Hgb A1c No results for input(s): HGBA1C in the last 72 hours. Lipid Profile No results for input(s): CHOL, HDL, LDLCALC, TRIG, CHOLHDL, LDLDIRECT in the last 72 hours. Thyroid  function studies Recent Labs    07/11/24 0623  TSH 0.652   Anemia work up Recent Labs    07/11/24 0623  VITAMINB12 2,651*  FOLATE 16.0   Urinalysis    Component Value Date/Time   COLORURINE YELLOW 07/05/2024 2032   APPEARANCEUR CLEAR 07/05/2024 2032   LABSPEC 1.026 07/05/2024 2032   PHURINE 5.0 07/05/2024 2032   GLUCOSEU NEGATIVE 07/05/2024 2032   HGBUR NEGATIVE 07/05/2024 2032   BILIRUBINUR NEGATIVE 07/05/2024 2032   KETONESUR 5 (A) 07/05/2024 2032   PROTEINUR NEGATIVE 07/05/2024 2032   NITRITE NEGATIVE 07/05/2024 2032   LEUKOCYTESUR NEGATIVE 07/05/2024 2032   Sepsis Labs Recent Labs  Lab 07/05/24 1931 07/06/24 0456 07/11/24 0623  WBC 6.1 5.8 5.1   Microbiology Recent Results (from the past 240 hours)  Culture, blood (routine x 2)     Status: None   Collection Time: 07/05/24  7:31 PM   Specimen: BLOOD  Result Value Ref Range Status   Specimen Description BLOOD BLOOD LEFT ARM  Final   Special Requests   Final    BOTTLES DRAWN AEROBIC AND ANAEROBIC Blood Culture results may not be optimal due to an inadequate volume of blood received in culture bottles   Culture   Final    NO GROWTH 5 DAYS Performed at [facility], [address], [place], [place] [address]     Report Status 07/10/2024 FINAL  Final  Culture, blood (routine x 2)     Status: None   Collection Time: 07/05/24  7:31 PM   Specimen: BLOOD  Result Value Ref Range Status   Specimen Description BLOOD BLOOD LEFT HAND  Final   Special Requests   Final    AEROBIC BOTTLE ONLY Blood Culture results may not be  optimal due to an inadequate volume of blood received in culture bottles   Culture   Final    NO GROWTH 5 DAYS Performed at [facility], [address], [place], [place] [address]    Report Status 07/10/2024 FINAL  Final     Time coordinating discharge: 35 minutes  SIGNED:   Sophie Mao, MD  [facility] 07/12/2024, 11:33 AM

## 2024-07-12 NOTE — Plan of Care (Signed)
  Problem: Education: Goal: Knowledge of General Education information will improve Description: Including pain rating scale, medication(s)/side effects and non-pharmacologic comfort measures Outcome: Not Met (add Reason)   Problem: Health Behavior/Discharge Planning: Goal: Ability to manage health-related needs will improve Outcome: Not Met (add Reason)   Problem: Clinical Measurements: Goal: Ability to maintain clinical measurements within normal limits will improve Outcome: Not Met (add Reason) Goal: Will remain free from infection Outcome: Not Met (add Reason) Goal: Diagnostic test results will improve Outcome: Not Met (add Reason) Goal: Respiratory complications will improve Outcome: Not Met (add Reason) Goal: Cardiovascular complication will be avoided Outcome: Not Met (add Reason)   Problem: Activity: Goal: Risk for activity intolerance will decrease Outcome: Not Met (add Reason)   Problem: Nutrition: Goal: Adequate nutrition will be maintained Outcome: Not Met (add Reason)   Problem: Coping: Goal: Level of anxiety will decrease Outcome: Not Met (add Reason)   Problem: Elimination: Goal: Will not experience complications related to bowel motility Outcome: Not Met (add Reason) Goal: Will not experience complications related to urinary retention Outcome: Not Met (add Reason)   Problem: Pain Managment: Goal: General experience of comfort will improve and/or be controlled Outcome: Not Met (add Reason)   Problem: Safety: Goal: Ability to remain free from injury will improve Outcome: Not Met (add Reason)   Problem: Skin Integrity: Goal: Risk for impaired skin integrity will decrease Outcome: Not Met (add Reason)

## 2024-08-20 DEATH — deceased
